# Patient Record
Sex: Female | Born: 1970 | Race: White | Hispanic: No | Marital: Single | Smoking: Never smoker
Health system: Southern US, Community
[De-identification: ages and names within clinical notes are randomized; demographics above are authoritative.]

## PROBLEM LIST (undated history)

## (undated) DIAGNOSIS — J302 Other seasonal allergic rhinitis: Secondary | ICD-10-CM

## (undated) HISTORY — PX: OTHER SURGICAL HISTORY: SHX169

---

## 2015-03-02 DIAGNOSIS — Z0000 Encounter for general adult medical examination without abnormal findings: Secondary | ICD-10-CM | POA: Diagnosis not present

## 2015-05-05 ENCOUNTER — Other Ambulatory Visit (HOSPITAL_BASED_OUTPATIENT_CLINIC_OR_DEPARTMENT_OTHER): Payer: Self-pay | Admitting: Unknown Physician Specialty

## 2015-05-05 DIAGNOSIS — Z1231 Encounter for screening mammogram for malignant neoplasm of breast: Secondary | ICD-10-CM

## 2015-05-10 DIAGNOSIS — H524 Presbyopia: Secondary | ICD-10-CM | POA: Diagnosis not present

## 2015-05-10 DIAGNOSIS — H5213 Myopia, bilateral: Secondary | ICD-10-CM | POA: Diagnosis not present

## 2015-05-16 ENCOUNTER — Ambulatory Visit (HOSPITAL_BASED_OUTPATIENT_CLINIC_OR_DEPARTMENT_OTHER)
Admission: RE | Admit: 2015-05-16 | Discharge: 2015-05-16 | Disposition: A | Discharge status: Home / Self Care | Payer: 59 | Source: Ambulatory Visit | Attending: Unknown Physician Specialty | Admitting: Unknown Physician Specialty

## 2015-05-16 DIAGNOSIS — Z1231 Encounter for screening mammogram for malignant neoplasm of breast: Secondary | ICD-10-CM | POA: Insufficient documentation

## 2015-07-13 DIAGNOSIS — Z01419 Encounter for gynecological examination (general) (routine) without abnormal findings: Secondary | ICD-10-CM | POA: Diagnosis not present

## 2015-07-13 DIAGNOSIS — Z1151 Encounter for screening for human papillomavirus (HPV): Secondary | ICD-10-CM | POA: Diagnosis not present

## 2015-10-19 ENCOUNTER — Telehealth: Payer: 59 | Admitting: Family

## 2015-10-19 DIAGNOSIS — H578 Other specified disorders of eye and adnexa: Secondary | ICD-10-CM

## 2015-10-19 DIAGNOSIS — H5789 Other specified disorders of eye and adnexa: Secondary | ICD-10-CM

## 2015-10-19 NOTE — Progress Notes (Signed)
We are sorry that you are not feeling well.  Here is how we plan to help!  Based on what you have shared with me it looks like you have conjunctivitis.  Conjunctivitis is a common inflammatory or infectious condition of the eye that is often referred to as "pink eye".  In most cases it is contagious (viral or bacterial). However, not all conjunctivitis requires antibiotics (ex. Allergic).  We have made appropriate suggestions for you based upon your presentation.  I recommend that you use OpconA, 1-2 drops every 4-6 hours (an over the counter allergy drop available at your local pharmacy).  Your pharmacist may have an alternative suggestion. It sounds like you irritated your eye. I would not wear your contacts for the next few days. You can use saline eye drops that moisturize.   Pink eye can be highly contagious.  It is typically spread through direct contact with secretions, or contaminated objects or surfaces that one may have touched.  Strict handwashing is suggested with soap and water is urged.  If not available, use alcohol based had sanitizer.  Avoid unnecessary touching of the eye.  If you wear contact lenses, you will need to refrain from wearing them until you see no white discharge from the eye for at least 24 hours after being on medication.  You should see symptom improvement in 1-2 days after starting the medication regimen.  Call us if symptoms are not improved in 1-2 days.  Home Care:  Wash your hands often!  Do not wear your contacts until you complete your treatment plan.  Avoid sharing towels, bed linen, personal items with a person who has pink eye.  See attention for anyone in your home with similar symptoms.  Get Help Right Away If:  Your symptoms do not improve.  You develop blurred or loss of vision.  Your symptoms worsen (increased discharge, pain or redness)  Your e-visit answers were reviewed by a board certified advanced clinical practitioner to complete your  personal care plan.  Depending on the condition, your plan could have included both over the counter or prescription medications.  If there is a problem please reply  once you have received a response from your provider.  Your safety is important to [place].  If you have drug allergies check your prescription carefully.    You can use MyChart to ask questions about today's visit, request a non-urgent call back, or ask for a work or school excuse for 24 hours related to this e-Visit. If it has been greater than 24 hours you will need to follow up with your provider, or enter a new e-Visit to address those concerns.   You will get an e-mail in the next two days asking about your experience.  I hope that your e-visit has been valuable and will speed your recovery. Thank you for using e-visits.

## 2016-03-02 DIAGNOSIS — Z0000 Encounter for general adult medical examination without abnormal findings: Secondary | ICD-10-CM | POA: Diagnosis not present

## 2016-03-07 DIAGNOSIS — K649 Unspecified hemorrhoids: Secondary | ICD-10-CM | POA: Diagnosis not present

## 2016-03-07 DIAGNOSIS — Z0000 Encounter for general adult medical examination without abnormal findings: Secondary | ICD-10-CM | POA: Diagnosis not present

## 2016-05-10 ENCOUNTER — Other Ambulatory Visit (HOSPITAL_BASED_OUTPATIENT_CLINIC_OR_DEPARTMENT_OTHER): Payer: Self-pay | Admitting: Unknown Physician Specialty

## 2016-05-10 DIAGNOSIS — Z1231 Encounter for screening mammogram for malignant neoplasm of breast: Secondary | ICD-10-CM

## 2016-05-21 ENCOUNTER — Ambulatory Visit (HOSPITAL_BASED_OUTPATIENT_CLINIC_OR_DEPARTMENT_OTHER)
Admission: RE | Admit: 2016-05-21 | Discharge: 2016-05-21 | Disposition: A | Discharge status: Home / Self Care | Payer: 59 | Source: Ambulatory Visit | Attending: Unknown Physician Specialty | Admitting: Unknown Physician Specialty

## 2016-05-21 DIAGNOSIS — Z1231 Encounter for screening mammogram for malignant neoplasm of breast: Secondary | ICD-10-CM | POA: Insufficient documentation

## 2016-07-25 DIAGNOSIS — D251 Intramural leiomyoma of uterus: Secondary | ICD-10-CM | POA: Diagnosis not present

## 2016-07-25 DIAGNOSIS — D250 Submucous leiomyoma of uterus: Secondary | ICD-10-CM | POA: Diagnosis not present

## 2016-07-25 DIAGNOSIS — Z01419 Encounter for gynecological examination (general) (routine) without abnormal findings: Secondary | ICD-10-CM | POA: Diagnosis not present

## 2016-07-25 DIAGNOSIS — N921 Excessive and frequent menstruation with irregular cycle: Secondary | ICD-10-CM | POA: Diagnosis not present

## 2016-08-02 MED FILL — FLUTICASONE PROP 50 MCG SPR: 50 | 30 days supply | Qty: 16 | Fill #0

## 2016-08-22 DIAGNOSIS — N921 Excessive and frequent menstruation with irregular cycle: Secondary | ICD-10-CM | POA: Diagnosis not present

## 2016-08-22 DIAGNOSIS — D250 Submucous leiomyoma of uterus: Secondary | ICD-10-CM | POA: Diagnosis not present

## 2016-08-22 DIAGNOSIS — D251 Intramural leiomyoma of uterus: Secondary | ICD-10-CM | POA: Diagnosis not present

## 2016-08-22 MED FILL — MEDROXYPROGESTERONE 10 MG T: 10 | 30 days supply | Qty: 30 | Fill #0

## 2016-11-15 DIAGNOSIS — H524 Presbyopia: Secondary | ICD-10-CM | POA: Diagnosis not present

## 2016-11-15 DIAGNOSIS — H5213 Myopia, bilateral: Secondary | ICD-10-CM | POA: Diagnosis not present

## 2016-12-09 ENCOUNTER — Emergency Department (INDEPENDENT_AMBULATORY_CARE_PROVIDER_SITE_OTHER)
Admission: EM | Admit: 2016-12-09 | Discharge: 2016-12-09 | Disposition: A | Discharge status: Home / Self Care | Payer: Worker's Compensation | Source: Home / Self Care | Attending: Emergency Medicine | Admitting: Emergency Medicine

## 2016-12-09 ENCOUNTER — Emergency Department (INDEPENDENT_AMBULATORY_CARE_PROVIDER_SITE_OTHER): Payer: Worker's Compensation

## 2016-12-09 ENCOUNTER — Encounter: Payer: Self-pay | Admitting: Emergency Medicine

## 2016-12-09 DIAGNOSIS — M25552 Pain in left hip: Secondary | ICD-10-CM

## 2016-12-09 DIAGNOSIS — S73102A Unspecified sprain of left hip, initial encounter: Secondary | ICD-10-CM | POA: Diagnosis not present

## 2016-12-09 DIAGNOSIS — S73112A Iliofemoral ligament sprain of left hip, initial encounter: Secondary | ICD-10-CM

## 2016-12-09 DIAGNOSIS — S79912A Unspecified injury of left hip, initial encounter: Secondary | ICD-10-CM | POA: Diagnosis not present

## 2016-12-09 HISTORY — DX: Other seasonal allergic rhinitis: J30.2

## 2016-12-09 MED ORDER — KETOROLAC TROMETHAMINE 60 MG/2ML IM SOLN
60.0000 mg | Freq: Once | INTRAMUSCULAR | Status: AC
  Administered 2016-12-09: 60 mg via INTRAMUSCULAR

## 2016-12-09 MED ORDER — CYCLOBENZAPRINE HCL 5 MG PO TABS: ORAL_TABLET | ORAL | 0 refills | Status: DC

## 2016-12-09 MED ORDER — MELOXICAM 7.5 MG PO TABS: ORAL_TABLET | ORAL | 0 refills | Status: DC

## 2016-12-09 NOTE — Discharge Instructions (Addendum)
You have a severe strain and sprain of left hip. Likely involving ilio febrile ligament sprain. X-ray of left hip today is negative for fracture or dislocation. For now, rest, ice, elevate. No work for now. Mild passive range of motion. Here in urgent care, we gave you shot of Toradol for pain. New prescriptions for meloxicam for pain and cyclobenzaprine for muscle relaxant. Make follow-up appointment with Dr. Barbaraann Barthel, sports medicine at [facility], in 3 days. 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

## 2016-12-09 NOTE — ED Provider Notes (Signed)
[facility]    CSN: [id number] Arrival date & time: 12/09/16  1154     History   Chief Complaint Chief Complaint  Patient presents with  . 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 She states that while on the job, 2 days ago, was caring for her patient. Ms. Egolf twisted and pivoted to prevent her patient from falling, and Ms. Hanssen felt acute and worsening left anterior and lateral hip pain. She tried ibuprofen without significant relief. The pain worsened severely today, now the pain is sharp and dull, 8 out of 10, worse with movement. No radiation. Denies back pain. No distal thigh or leg pain. No abdominal pain. No fever or chills or nausea or vomiting or chest pain or shortness of breath. Denies a prior history of left hip pain.  HPI Elaine Wyatt is a 46 y.o. female.   HPI  Past Medical History:  Diagnosis Date  . Seasonal allergies     There are no active problems to display for this patient.   Past Surgical History:  Procedure Laterality Date  . uterine fibroidectomy      OB History    No data available       Home Medications    Prior to Admission medications   Medication Sig Start Date End Date Taking? Authorizing Provider  cetirizine (ZYRTEC) 10 MG tablet Take 10 mg by mouth daily.   Yes [provider]  cyclobenzaprine (FLEXERIL) 5 MG tablet Take 1 every 8 hours as needed for muscle relaxant. May take 2 at bedtime. Caution: May cause drowsiness 12/09/16   Jacqulyn Cane, MD  meloxicam (MOBIC) 7.5 MG tablet Take 1 twice a day as needed for pain. Take with food. (Do not take with any other NSAID.) 12/09/16   Jacqulyn Cane, MD    Family History History reviewed. No pertinent family history.  Social History Social History  Substance Use Topics  . Smoking status: Never Smoker  . Smokeless tobacco: Never Used  . Alcohol use Yes     Allergies   Codeine   Review of  Systems Review of Systems  All other systems reviewed and are negative.    Physical Exam Triage Vital Signs ED Triage Vitals [12/09/16 1232]  Enc Vitals Group     BP 112/74     Pulse Rate 87     Resp 16     Temp 98.1 F (36.7 C)     Temp Source Oral     SpO2      Weight 168 lb (76.2 kg)     Height 5' 11.5" (1.816 m)     Head Circumference      Peak Flow      Pain Score 8     Pain Loc      Pain Edu?      Excl. in [place]?    No data found.   Updated Vital Signs BP 112/74 (BP Location: Left Arm)   Pulse 87   Temp 98.1 F (36.7 C) (Oral)   Resp 16   Ht 5' 11.5" (1.816 m)   Wt 168 lb (76.2 kg)   LMP 12/06/2016 (Exact Date)   BMI 23.10 kg/m   Visual Acuity Right Eye Distance:   Left Eye Distance:   Bilateral Distance:    Right Eye Near:   Left Eye Near:    Bilateral Near:     Physical Exam  Constitutional: She is oriented  to person, place, and time. She appears well-developed and well-nourished.  Alert, Cooperative. She is very uncomfortable from left hip pain, avoiding full weight-bearing of left hip, and she moves very slowly from chair to exam table  HENT:  Head: Normocephalic and atraumatic.  Eyes: Pupils are equal, round, and reactive to light. Conjunctivae are normal.  Neck: Normal range of motion. Neck supple.  Nontender. No C-spine tenderness or deformity  Cardiovascular: Normal rate and regular rhythm.   Pulmonary/Chest: Effort normal.  Abdominal: She exhibits no distension.  Musculoskeletal:       Right hip: Normal.       Left hip: She exhibits decreased range of motion, tenderness and bony tenderness. She exhibits no swelling, no deformity and no laceration.       Lumbar back: Normal.       Right upper leg: She exhibits no bony tenderness, no swelling and no edema.       Left upper leg: Normal.  Left hip pain greatly exacerbated by internal and external rotation of left hip.  Neurological: She is alert and oriented to person, place, and time. She  displays normal reflexes. No cranial nerve deficit or sensory deficit. She exhibits normal muscle tone. Coordination normal.  Skin: Skin is warm and dry. Capillary refill takes less than 2 seconds. No rash noted.  Psychiatric: She has a normal mood and affect.   She has a cane which she uses to walk slowly. Avoiding weightbearing left hip.  UC Treatments / Results  Labs (all labs ordered are listed, but only abnormal results are displayed) Labs Reviewed - No data to display  EKG  EKG Interpretation None       Radiology Dg Hip Unilat W Or Wo Pelvis 2-3 Views Left  Result Date: 12/09/2016 CLINICAL DATA:  Left hip pain, twisting injury EXAM: DG HIP (WITH OR WITHOUT PELVIS) 2-3V LEFT COMPARISON:  None. FINDINGS: No fracture or dislocation is seen. Bilateral hip joint spaces are preserved. Visualized bony pelvis appears intact. Suspected benign bone island in the left inferior pubic ramus. IMPRESSION: Negative. Electronically Signed   By: Julian Hy M.D.   On: 12/09/2016 12:36    Procedures Procedures (including critical care time)  Medications Ordered in UC Medications  ketorolac (TORADOL) injection 60 mg (not administered)     Initial Impression / Assessment and Plan / UC Course  I have reviewed the triage vital signs and the nursing notes.  Pertinent labs & imaging results that were available during my care of the patient were reviewed by me and considered in my medical decision making (see chart for details).     Reviewed with her that x-ray left hip shows no acute bony abnormality. I gave her a copy of the radiology report and we discussed suspected benign bone island in the left inferior pubic ramus, and advised to follow-up that finding with her PCP or orthopedist, and I believe the bone island is a benign and non-related to the acute left hip injury.  Final Clinical Impressions(s) / UC Diagnoses   Final diagnoses:  Sprain of left hip, initial encounter   Iliofemoral ligament sprain of left hip, initial encounter  Treatment options discussed, as well as risks, benefits, alternatives. Patient voiced understanding and agreement with the following plans: Ice, rest. Meloxicam 7.5 mg bid with food for pain. Flexeril for muscle relaxant, precautions discussed. She declined any stronger pain med, and she is trying to avoid any meds which could cause constipation, so I'm not prescribing any narcotic  pain med. An After Visit Summary was printed and given to the patient. "You have a severe strain and sprain of left hip. Likely involving ilio febrile ligament sprain. X-ray of left hip today is negative for fracture or dislocation. For now, rest, ice, elevate. No work for now. Mild passive range of motion. Here in urgent care, we gave you shot of Toradol for pain. New prescriptions for meloxicam for pain and cyclobenzaprine for muscle relaxant. Make follow-up appointment with Dr. Barbaraann Barthel, sports medicine at [facility], in 3 days. 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  New Prescriptions New Prescriptions   CYCLOBENZAPRINE (FLEXERIL) 5 MG TABLET    Take 1 every 8 hours as needed for muscle relaxant. May take 2 at bedtime. Caution: May cause drowsiness   MELOXICAM (MOBIC) 7.5 MG TABLET    Take 1 twice a day as needed for pain. Take with food. (Do not take with any other NSAID.)   Precautions discussed. Red flags discussed. Questions invited and answered. Patient voiced understanding and agreement.     Jacqulyn Cane, MD 12/09/16 2102

## 2016-12-09 NOTE — ED Triage Notes (Signed)
Immediate supervisor, Lum Babe, contacted by phone and states no drug or alcohol screening is necessary.

## 2016-12-09 NOTE — ED Triage Notes (Signed)
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 Yesterday and today has noticed progressively worse pain in left hip; came in with help of cane today. Refusing pain medication due to previous problems with serious constipation in past following pain meds.

## 2016-12-12 ENCOUNTER — Ambulatory Visit: Payer: Self-pay | Admitting: Family Medicine

## 2017-03-14 DIAGNOSIS — J3089 Other allergic rhinitis: Secondary | ICD-10-CM | POA: Diagnosis not present

## 2017-03-14 DIAGNOSIS — Z0000 Encounter for general adult medical examination without abnormal findings: Secondary | ICD-10-CM | POA: Diagnosis not present

## 2017-03-14 DIAGNOSIS — K644 Residual hemorrhoidal skin tags: Secondary | ICD-10-CM | POA: Diagnosis not present

## 2017-03-14 DIAGNOSIS — K5900 Constipation, unspecified: Secondary | ICD-10-CM | POA: Diagnosis not present

## 2017-04-05 DIAGNOSIS — R509 Fever, unspecified: Secondary | ICD-10-CM | POA: Diagnosis not present

## 2017-04-05 DIAGNOSIS — J101 Influenza due to other identified influenza virus with other respiratory manifestations: Secondary | ICD-10-CM | POA: Diagnosis not present

## 2017-04-05 MED FILL — HYDROCODONE-HOMATROPINE SOL: 5-1.5 | 4 days supply | Qty: 120 | Fill #0

## 2017-04-09 ENCOUNTER — Other Ambulatory Visit (HOSPITAL_BASED_OUTPATIENT_CLINIC_OR_DEPARTMENT_OTHER): Payer: Self-pay | Admitting: Unknown Physician Specialty

## 2017-04-09 DIAGNOSIS — Z1231 Encounter for screening mammogram for malignant neoplasm of breast: Secondary | ICD-10-CM

## 2017-05-22 ENCOUNTER — Encounter (HOSPITAL_BASED_OUTPATIENT_CLINIC_OR_DEPARTMENT_OTHER): Payer: Self-pay

## 2017-05-22 ENCOUNTER — Ambulatory Visit (HOSPITAL_BASED_OUTPATIENT_CLINIC_OR_DEPARTMENT_OTHER)
Admission: RE | Admit: 2017-05-22 | Discharge: 2017-05-22 | Disposition: A | Discharge status: Home / Self Care | Payer: 59 | Source: Ambulatory Visit | Attending: Unknown Physician Specialty | Admitting: Unknown Physician Specialty

## 2017-05-22 DIAGNOSIS — Z1231 Encounter for screening mammogram for malignant neoplasm of breast: Secondary | ICD-10-CM | POA: Insufficient documentation

## 2017-07-10 DIAGNOSIS — D2261 Melanocytic nevi of right upper limb, including shoulder: Secondary | ICD-10-CM | POA: Diagnosis not present

## 2017-07-10 DIAGNOSIS — D225 Melanocytic nevi of trunk: Secondary | ICD-10-CM | POA: Diagnosis not present

## 2017-07-10 DIAGNOSIS — D2262 Melanocytic nevi of left upper limb, including shoulder: Secondary | ICD-10-CM | POA: Diagnosis not present

## 2017-07-31 DIAGNOSIS — N951 Menopausal and female climacteric states: Secondary | ICD-10-CM | POA: Diagnosis not present

## 2017-07-31 DIAGNOSIS — N921 Excessive and frequent menstruation with irregular cycle: Secondary | ICD-10-CM | POA: Diagnosis not present

## 2017-07-31 DIAGNOSIS — Z01419 Encounter for gynecological examination (general) (routine) without abnormal findings: Secondary | ICD-10-CM | POA: Diagnosis not present

## 2017-11-21 DIAGNOSIS — H5213 Myopia, bilateral: Secondary | ICD-10-CM | POA: Diagnosis not present

## 2017-11-21 DIAGNOSIS — H524 Presbyopia: Secondary | ICD-10-CM | POA: Diagnosis not present

## 2018-03-12 DIAGNOSIS — N912 Amenorrhea, unspecified: Secondary | ICD-10-CM | POA: Diagnosis not present

## 2018-03-21 DIAGNOSIS — Z0000 Encounter for general adult medical examination without abnormal findings: Secondary | ICD-10-CM | POA: Diagnosis not present

## 2018-03-21 DIAGNOSIS — Z13220 Encounter for screening for lipoid disorders: Secondary | ICD-10-CM | POA: Diagnosis not present

## 2018-03-21 DIAGNOSIS — Z8249 Family history of ischemic heart disease and other diseases of the circulatory system: Secondary | ICD-10-CM | POA: Diagnosis not present

## 2018-03-21 DIAGNOSIS — Z1329 Encounter for screening for other suspected endocrine disorder: Secondary | ICD-10-CM | POA: Diagnosis not present

## 2018-05-14 ENCOUNTER — Other Ambulatory Visit (HOSPITAL_BASED_OUTPATIENT_CLINIC_OR_DEPARTMENT_OTHER): Payer: Self-pay | Admitting: Internal Medicine

## 2018-05-14 DIAGNOSIS — Z1231 Encounter for screening mammogram for malignant neoplasm of breast: Secondary | ICD-10-CM

## 2018-05-28 ENCOUNTER — Ambulatory Visit (HOSPITAL_BASED_OUTPATIENT_CLINIC_OR_DEPARTMENT_OTHER): Payer: Self-pay

## 2018-07-15 ENCOUNTER — Ambulatory Visit (HOSPITAL_BASED_OUTPATIENT_CLINIC_OR_DEPARTMENT_OTHER)
Admission: RE | Admit: 2018-07-15 | Discharge: 2018-07-15 | Disposition: A | Discharge status: Home / Self Care | Payer: 59 | Source: Ambulatory Visit | Attending: Internal Medicine | Admitting: Internal Medicine

## 2018-07-15 ENCOUNTER — Other Ambulatory Visit: Payer: Self-pay

## 2018-07-15 DIAGNOSIS — Z1231 Encounter for screening mammogram for malignant neoplasm of breast: Secondary | ICD-10-CM | POA: Diagnosis not present

## 2018-10-15 DIAGNOSIS — N951 Menopausal and female climacteric states: Secondary | ICD-10-CM | POA: Diagnosis not present

## 2018-10-15 DIAGNOSIS — Z01419 Encounter for gynecological examination (general) (routine) without abnormal findings: Secondary | ICD-10-CM | POA: Diagnosis not present

## 2018-11-24 DIAGNOSIS — L700 Acne vulgaris: Secondary | ICD-10-CM | POA: Diagnosis not present

## 2018-11-24 MED FILL — SPIRONOLACTONE 25 MG TABLET: 25 | 30 days supply | Qty: 60 | Fill #0

## 2018-11-24 MED FILL — CLINDAMYCIN PH 1% SOLUTION: 1 | 14 days supply | Qty: 60 | Fill #0

## 2018-12-03 DIAGNOSIS — H524 Presbyopia: Secondary | ICD-10-CM | POA: Diagnosis not present

## 2018-12-03 DIAGNOSIS — H5213 Myopia, bilateral: Secondary | ICD-10-CM | POA: Diagnosis not present

## 2018-12-22 MED FILL — SPIRONOLACTONE 25 MG TABLET: 25 | 30 days supply | Qty: 60 | Fill #1

## 2019-01-20 MED FILL — CLINDAMYCIN PHOSPHATE 1 % S: 1 | 14 days supply | Qty: 60 | Fill #1

## 2019-01-20 MED FILL — SPIRONOLACTONE 25 MG TABS: 25 | 30 days supply | Qty: 60 | Fill #2

## 2019-03-05 DIAGNOSIS — L700 Acne vulgaris: Secondary | ICD-10-CM | POA: Diagnosis not present

## 2019-03-09 MED FILL — DOXYCYCLINE HYCLATE 20 MG T: 20 | 30 days supply | Qty: 60 | Fill #0

## 2019-04-23 MED FILL — DOXYCYCLINE HYCLATE 20 MG T: 20 | 30 days supply | Qty: 60 | Fill #1

## 2019-04-23 MED FILL — CLINDAMYCIN PHOSPHATE 1 % S: 1 | 14 days supply | Qty: 60 | Fill #2

## 2019-05-08 DIAGNOSIS — N921 Excessive and frequent menstruation with irregular cycle: Secondary | ICD-10-CM | POA: Diagnosis not present

## 2019-05-08 DIAGNOSIS — J309 Allergic rhinitis, unspecified: Secondary | ICD-10-CM | POA: Diagnosis not present

## 2019-05-08 DIAGNOSIS — Z0000 Encounter for general adult medical examination without abnormal findings: Secondary | ICD-10-CM | POA: Diagnosis not present

## 2019-05-08 DIAGNOSIS — K649 Unspecified hemorrhoids: Secondary | ICD-10-CM | POA: Diagnosis not present

## 2019-05-08 DIAGNOSIS — D259 Leiomyoma of uterus, unspecified: Secondary | ICD-10-CM | POA: Diagnosis not present

## 2019-05-26 DIAGNOSIS — L700 Acne vulgaris: Secondary | ICD-10-CM | POA: Diagnosis not present

## 2019-06-17 ENCOUNTER — Other Ambulatory Visit (HOSPITAL_BASED_OUTPATIENT_CLINIC_OR_DEPARTMENT_OTHER): Payer: Self-pay | Admitting: Internal Medicine

## 2019-06-17 DIAGNOSIS — Z1231 Encounter for screening mammogram for malignant neoplasm of breast: Secondary | ICD-10-CM

## 2019-07-15 ENCOUNTER — Ambulatory Visit (HOSPITAL_BASED_OUTPATIENT_CLINIC_OR_DEPARTMENT_OTHER): Payer: 59

## 2019-07-20 ENCOUNTER — Other Ambulatory Visit: Payer: Self-pay

## 2019-07-20 ENCOUNTER — Ambulatory Visit (HOSPITAL_BASED_OUTPATIENT_CLINIC_OR_DEPARTMENT_OTHER)
Admission: RE | Admit: 2019-07-20 | Discharge: 2019-07-20 | Disposition: A | Discharge status: Home / Self Care | Payer: 59 | Source: Ambulatory Visit | Attending: Internal Medicine | Admitting: Internal Medicine

## 2019-07-20 DIAGNOSIS — Z1231 Encounter for screening mammogram for malignant neoplasm of breast: Secondary | ICD-10-CM | POA: Insufficient documentation

## 2019-08-28 MED FILL — CLINDAMYCIN PHOSPHATE 1 % S: 1 | 14 days supply | Qty: 60 | Fill #3

## 2019-10-28 DIAGNOSIS — N958 Other specified menopausal and perimenopausal disorders: Secondary | ICD-10-CM | POA: Diagnosis not present

## 2019-10-28 DIAGNOSIS — Z01419 Encounter for gynecological examination (general) (routine) without abnormal findings: Secondary | ICD-10-CM | POA: Diagnosis not present

## 2019-10-28 DIAGNOSIS — N921 Excessive and frequent menstruation with irregular cycle: Secondary | ICD-10-CM | POA: Diagnosis not present

## 2019-10-28 DIAGNOSIS — Z1151 Encounter for screening for human papillomavirus (HPV): Secondary | ICD-10-CM | POA: Diagnosis not present

## 2019-11-26 ENCOUNTER — Other Ambulatory Visit (HOSPITAL_BASED_OUTPATIENT_CLINIC_OR_DEPARTMENT_OTHER): Payer: Self-pay | Admitting: Dermatology

## 2019-11-26 DIAGNOSIS — L700 Acne vulgaris: Secondary | ICD-10-CM | POA: Diagnosis not present

## 2019-11-26 MED FILL — CLINDAMYCIN PHOSPHATE 1 % S: 1 | 14 days supply | Qty: 60 | Fill #0

## 2019-12-09 DIAGNOSIS — H524 Presbyopia: Secondary | ICD-10-CM | POA: Diagnosis not present

## 2019-12-09 DIAGNOSIS — H5213 Myopia, bilateral: Secondary | ICD-10-CM | POA: Diagnosis not present

## 2019-12-18 ENCOUNTER — Other Ambulatory Visit (HOSPITAL_BASED_OUTPATIENT_CLINIC_OR_DEPARTMENT_OTHER): Payer: Self-pay | Admitting: Internal Medicine

## 2019-12-18 ENCOUNTER — Ambulatory Visit: Payer: 59 | Attending: Internal Medicine

## 2019-12-18 DIAGNOSIS — Z23 Encounter for immunization: Secondary | ICD-10-CM

## 2019-12-25 MED FILL — PFIZER-BIONTECH COVID-19 VA: 30 | 1 days supply | Qty: 0 | Fill #0

## 2020-01-15 ENCOUNTER — Ambulatory Visit: Payer: 59

## 2020-04-05 ENCOUNTER — Other Ambulatory Visit (HOSPITAL_BASED_OUTPATIENT_CLINIC_OR_DEPARTMENT_OTHER): Payer: Self-pay | Admitting: Dermatology

## 2020-04-05 DIAGNOSIS — D2261 Melanocytic nevi of right upper limb, including shoulder: Secondary | ICD-10-CM | POA: Diagnosis not present

## 2020-04-05 DIAGNOSIS — B078 Other viral warts: Secondary | ICD-10-CM | POA: Diagnosis not present

## 2020-04-05 DIAGNOSIS — L700 Acne vulgaris: Secondary | ICD-10-CM | POA: Diagnosis not present

## 2020-04-05 DIAGNOSIS — L308 Other specified dermatitis: Secondary | ICD-10-CM | POA: Diagnosis not present

## 2020-04-05 DIAGNOSIS — D2272 Melanocytic nevi of left lower limb, including hip: Secondary | ICD-10-CM | POA: Diagnosis not present

## 2020-04-05 DIAGNOSIS — D2371 Other benign neoplasm of skin of right lower limb, including hip: Secondary | ICD-10-CM | POA: Diagnosis not present

## 2020-04-05 DIAGNOSIS — D225 Melanocytic nevi of trunk: Secondary | ICD-10-CM | POA: Diagnosis not present

## 2020-04-05 DIAGNOSIS — D2262 Melanocytic nevi of left upper limb, including shoulder: Secondary | ICD-10-CM | POA: Diagnosis not present

## 2020-04-05 MED FILL — CLOBETASOL PROPIONATE 0.05: 0.05 | 15 days supply | Qty: 15 | Fill #0

## 2020-04-11 MED FILL — CLINDAMYCIN PHOSPHATE 1 % S: 1 | 14 days supply | Qty: 60 | Fill #1

## 2020-05-11 DIAGNOSIS — Z130 Encounter for screening for diseases of the blood and blood-forming organs and certain disorders involving the immune mechanism: Secondary | ICD-10-CM | POA: Diagnosis not present

## 2020-05-11 DIAGNOSIS — Z1329 Encounter for screening for other suspected endocrine disorder: Secondary | ICD-10-CM | POA: Diagnosis not present

## 2020-05-11 DIAGNOSIS — Z0000 Encounter for general adult medical examination without abnormal findings: Secondary | ICD-10-CM | POA: Diagnosis not present

## 2020-05-11 DIAGNOSIS — Z13220 Encounter for screening for lipoid disorders: Secondary | ICD-10-CM | POA: Diagnosis not present

## 2020-05-11 DIAGNOSIS — L719 Rosacea, unspecified: Secondary | ICD-10-CM | POA: Diagnosis not present

## 2020-05-11 DIAGNOSIS — Z0001 Encounter for general adult medical examination with abnormal findings: Secondary | ICD-10-CM | POA: Diagnosis not present

## 2020-05-11 DIAGNOSIS — Z23 Encounter for immunization: Secondary | ICD-10-CM | POA: Diagnosis not present

## 2020-05-11 DIAGNOSIS — Z8249 Family history of ischemic heart disease and other diseases of the circulatory system: Secondary | ICD-10-CM | POA: Diagnosis not present

## 2020-05-12 ENCOUNTER — Other Ambulatory Visit (HOSPITAL_BASED_OUTPATIENT_CLINIC_OR_DEPARTMENT_OTHER): Payer: Self-pay | Admitting: Internal Medicine

## 2020-05-16 IMAGING — MG DIGITAL SCREENING BILATERAL MAMMOGRAM WITH TOMO AND CAD
8 series · 9 of 24 positions shown · non-contrast
Comparison: Previous exam(s).

CLINICAL DATA: Screening.

EXAM:
DIGITAL SCREENING BILATERAL MAMMOGRAM WITH TOMO AND CAD

[L MLO synth-2D]
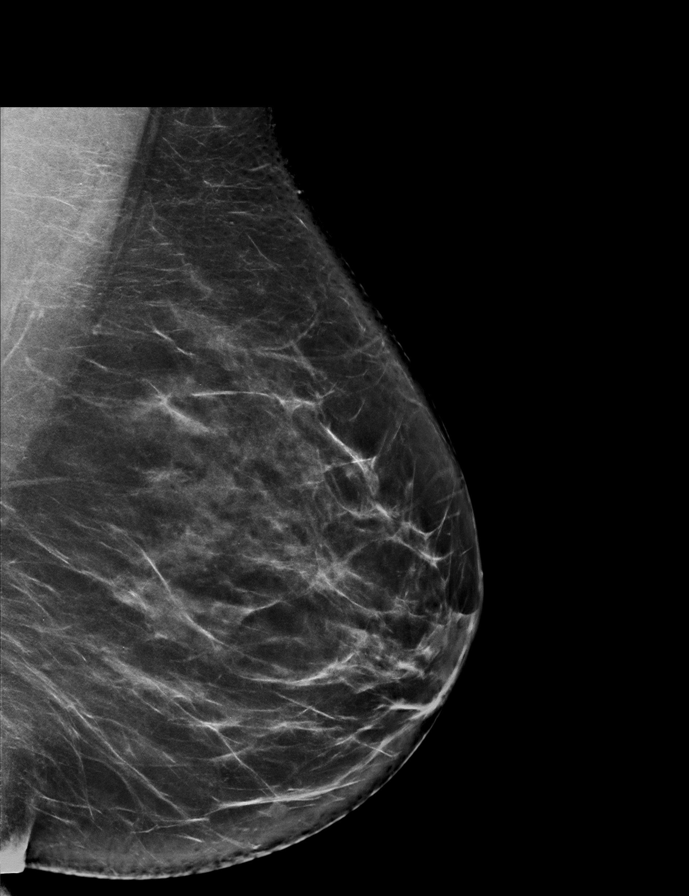

[L CC synth-2D]
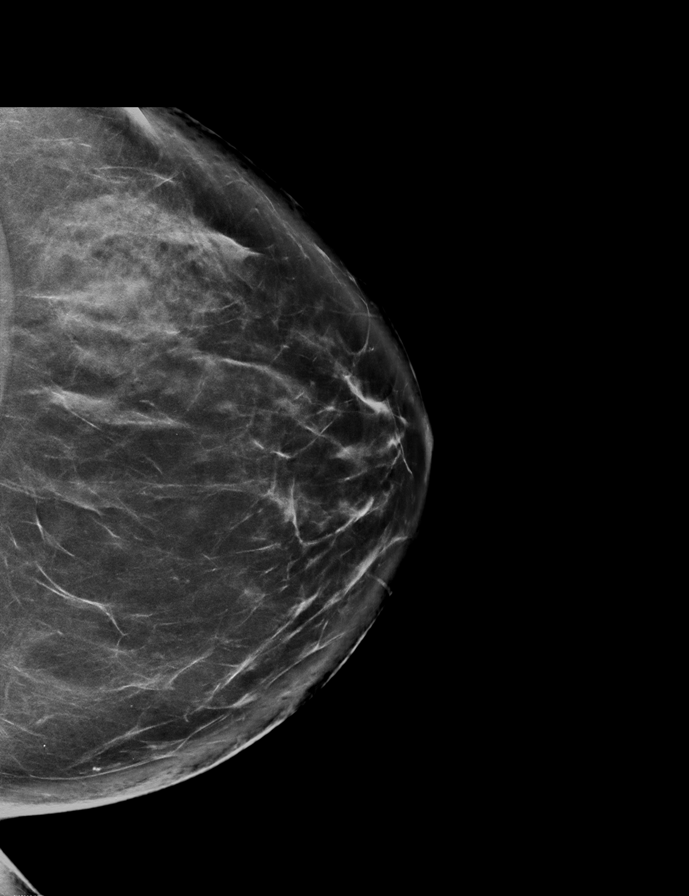

[R CC synth-2D]
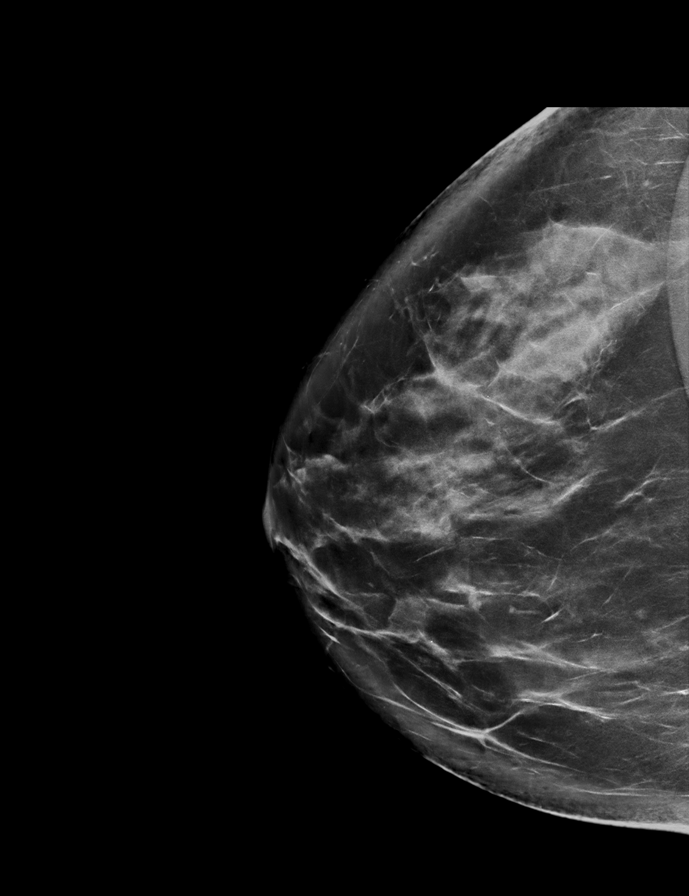

[R MLO synth-2D]
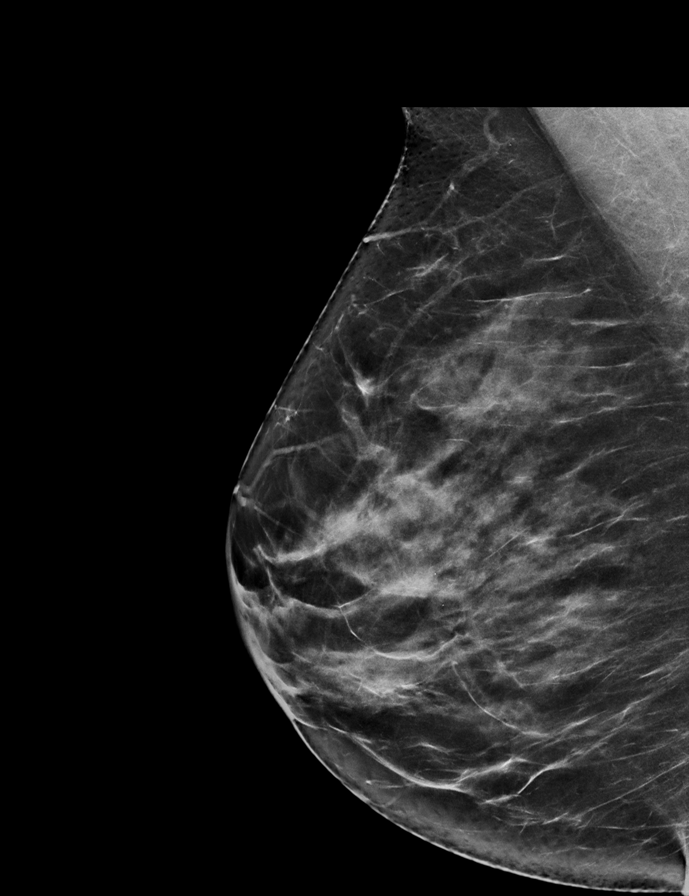

[R CC tomo · 2 of 90 frames shown]
[frame 29/90]
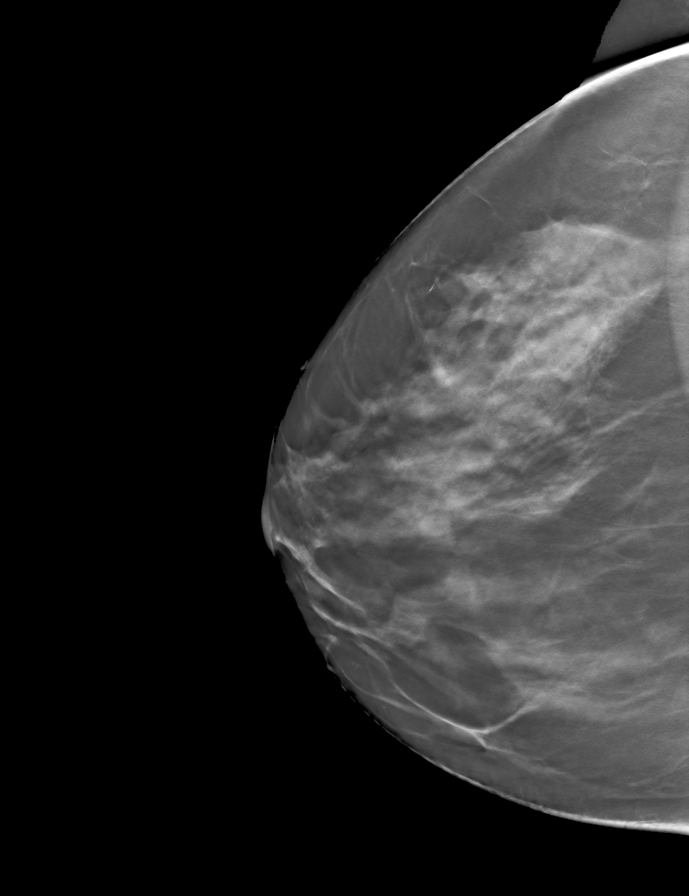
[frame 45/90]
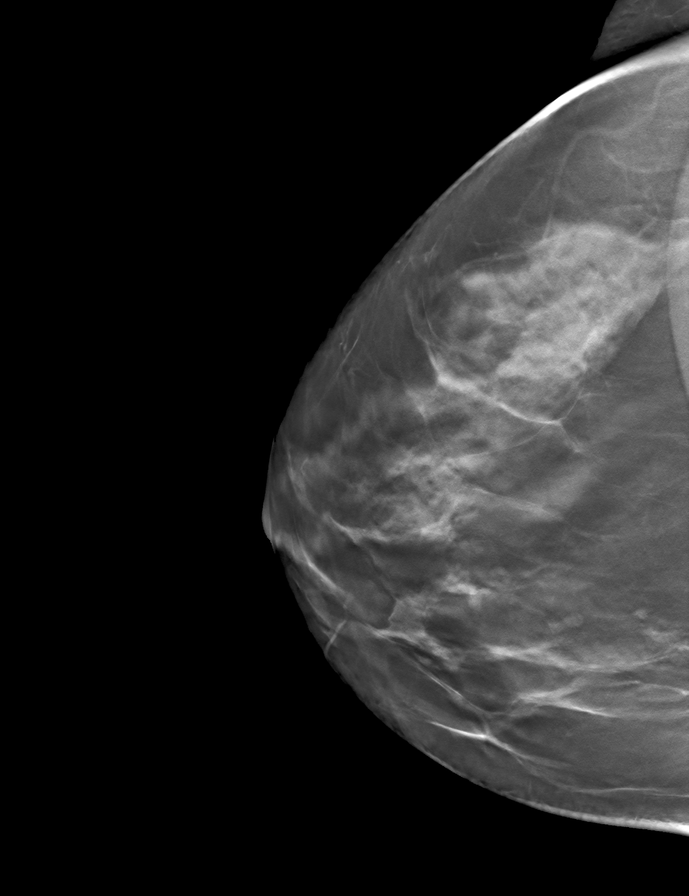

[L MLO tomo · tomo slice 43/86.0]
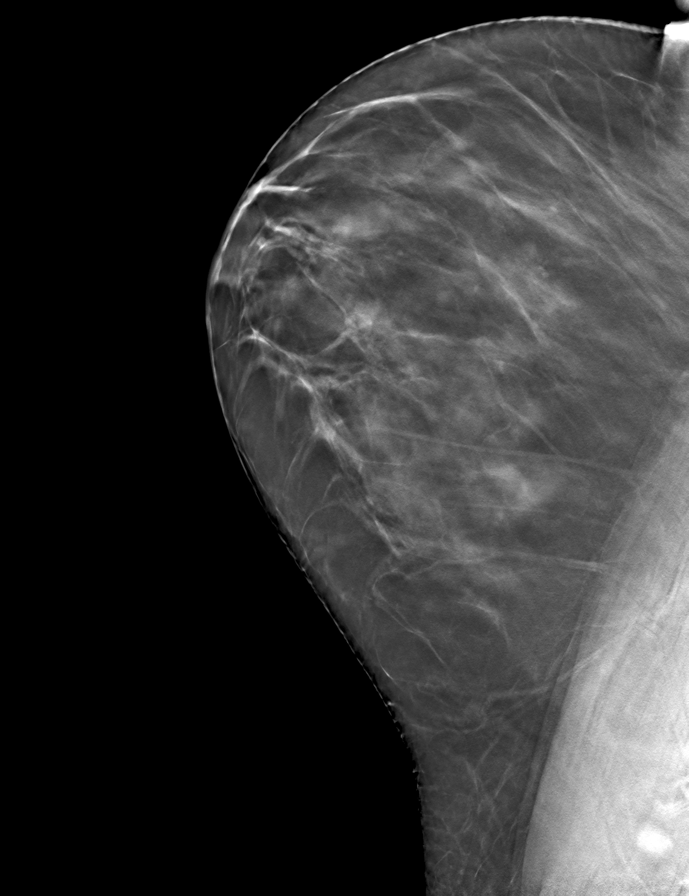

[R MLO tomo · tomo slice 40/79.0]
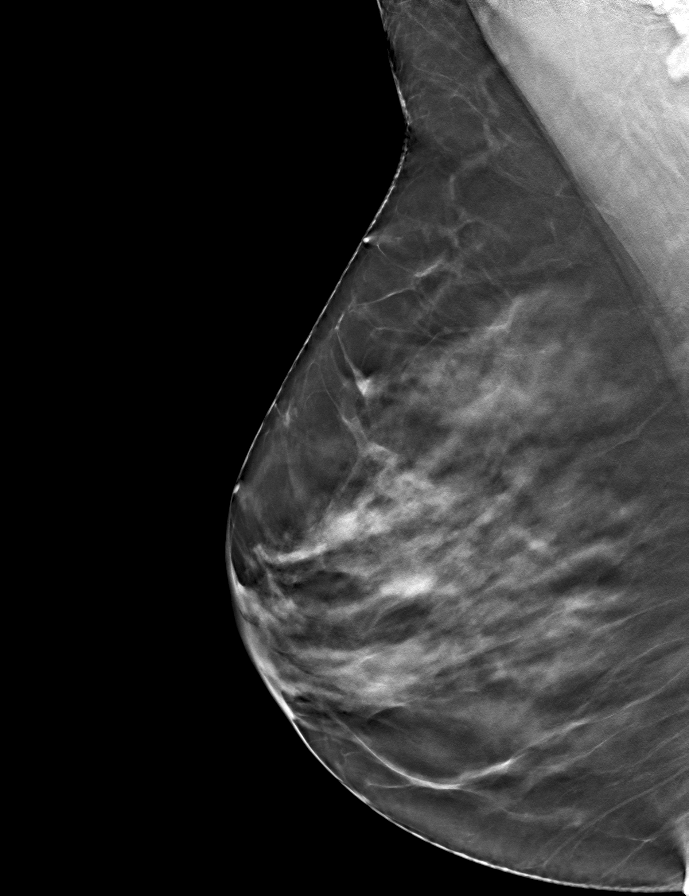

[L CC tomo · tomo slice 44/87.0]
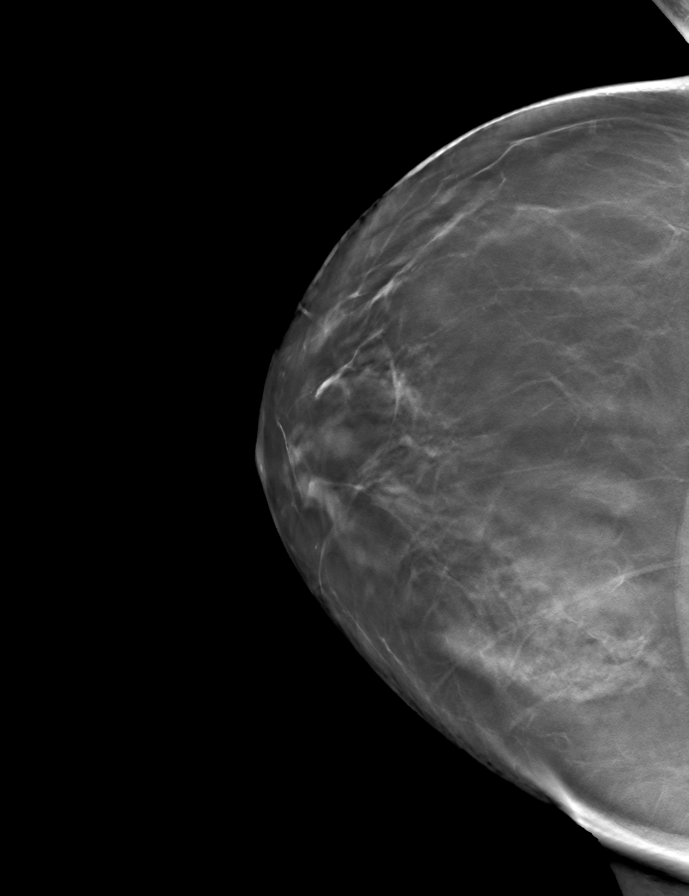

[9 of 24 positions shown; findings below may reference images not displayed]

ACR Breast Density Category c: The breast tissue is heterogeneously
dense, which may obscure small masses.
FINDINGS: There are no findings suspicious for malignancy. Images were
processed with CAD.
IMPRESSION: No mammographic evidence of malignancy. A result letter of this
screening mammogram will be mailed directly to the patient.

RECOMMENDATION:
Screening mammogram in one year. (Code:[id number]-[id number])

BI-RADS CATEGORY  1: Negative.

## 2020-06-08 ENCOUNTER — Other Ambulatory Visit (HOSPITAL_BASED_OUTPATIENT_CLINIC_OR_DEPARTMENT_OTHER): Payer: Self-pay

## 2020-06-08 ENCOUNTER — Other Ambulatory Visit (HOSPITAL_BASED_OUTPATIENT_CLINIC_OR_DEPARTMENT_OTHER): Payer: Self-pay | Admitting: Internal Medicine

## 2020-06-08 DIAGNOSIS — Z1231 Encounter for screening mammogram for malignant neoplasm of breast: Secondary | ICD-10-CM

## 2020-06-08 MED FILL — Rosuvastatin Calcium Tab 5 MG: ORAL | 30 days supply | Qty: 30 | Fill #0 | Status: AC

## 2020-07-05 ENCOUNTER — Other Ambulatory Visit (HOSPITAL_BASED_OUTPATIENT_CLINIC_OR_DEPARTMENT_OTHER): Payer: Self-pay

## 2020-07-05 MED ORDER — ROSUVASTATIN CALCIUM 5 MG PO TABS
ORAL_TABLET | ORAL | 3 refills | Status: DC
  Filled 2020-07-05: qty 30, 30d supply, fill #0
  Filled 2020-08-17: qty 30, 30d supply, fill #1
  Filled 2020-09-12: qty 30, 30d supply, fill #2
  Filled 2020-10-11: qty 30, 30d supply, fill #3

## 2020-07-21 ENCOUNTER — Other Ambulatory Visit: Payer: Self-pay

## 2020-07-21 ENCOUNTER — Encounter (HOSPITAL_BASED_OUTPATIENT_CLINIC_OR_DEPARTMENT_OTHER): Payer: Self-pay

## 2020-07-21 ENCOUNTER — Ambulatory Visit (HOSPITAL_BASED_OUTPATIENT_CLINIC_OR_DEPARTMENT_OTHER)
Admission: RE | Admit: 2020-07-21 | Discharge: 2020-07-21 | Disposition: A | Discharge status: Home / Self Care | Payer: 59 | Source: Ambulatory Visit | Attending: Internal Medicine | Admitting: Internal Medicine

## 2020-07-21 ENCOUNTER — Other Ambulatory Visit (HOSPITAL_BASED_OUTPATIENT_CLINIC_OR_DEPARTMENT_OTHER): Payer: Self-pay

## 2020-07-21 DIAGNOSIS — Z1231 Encounter for screening mammogram for malignant neoplasm of breast: Secondary | ICD-10-CM | POA: Diagnosis not present

## 2020-07-21 MED FILL — Clindamycin Phosphate Soln 1%: CUTANEOUS | 30 days supply | Qty: 60 | Fill #0 | Status: AC

## 2020-07-22 ENCOUNTER — Ambulatory Visit (HOSPITAL_BASED_OUTPATIENT_CLINIC_OR_DEPARTMENT_OTHER): Payer: 59

## 2020-08-12 DIAGNOSIS — E785 Hyperlipidemia, unspecified: Secondary | ICD-10-CM | POA: Diagnosis not present

## 2020-08-17 ENCOUNTER — Other Ambulatory Visit (HOSPITAL_BASED_OUTPATIENT_CLINIC_OR_DEPARTMENT_OTHER): Payer: Self-pay

## 2020-09-12 ENCOUNTER — Other Ambulatory Visit (HOSPITAL_BASED_OUTPATIENT_CLINIC_OR_DEPARTMENT_OTHER): Payer: Self-pay

## 2020-10-11 ENCOUNTER — Other Ambulatory Visit (HOSPITAL_BASED_OUTPATIENT_CLINIC_OR_DEPARTMENT_OTHER): Payer: Self-pay

## 2020-11-09 ENCOUNTER — Other Ambulatory Visit (HOSPITAL_BASED_OUTPATIENT_CLINIC_OR_DEPARTMENT_OTHER): Payer: Self-pay

## 2020-11-09 DIAGNOSIS — Z01419 Encounter for gynecological examination (general) (routine) without abnormal findings: Secondary | ICD-10-CM | POA: Diagnosis not present

## 2020-11-09 MED ORDER — ROSUVASTATIN CALCIUM 5 MG PO TABS
5.0000 mg | ORAL_TABLET | Freq: Every day | ORAL | 3 refills | Status: DC
  Filled 2020-11-09: qty 30, 30d supply, fill #0
  Filled 2020-12-08: qty 30, 30d supply, fill #1
  Filled 2021-01-10: qty 30, 30d supply, fill #2
  Filled 2021-02-09: qty 30, 30d supply, fill #3

## 2020-11-09 MED FILL — Clindamycin Phosphate Soln 1%: CUTANEOUS | 30 days supply | Qty: 60 | Fill #1 | Status: AC

## 2020-11-24 DIAGNOSIS — L700 Acne vulgaris: Secondary | ICD-10-CM | POA: Diagnosis not present

## 2020-12-09 ENCOUNTER — Other Ambulatory Visit (HOSPITAL_BASED_OUTPATIENT_CLINIC_OR_DEPARTMENT_OTHER): Payer: Self-pay

## 2020-12-14 DIAGNOSIS — H5213 Myopia, bilateral: Secondary | ICD-10-CM | POA: Diagnosis not present

## 2020-12-16 ENCOUNTER — Ambulatory Visit: Payer: 59 | Attending: Internal Medicine

## 2020-12-16 DIAGNOSIS — Z23 Encounter for immunization: Secondary | ICD-10-CM

## 2020-12-16 NOTE — Progress Notes (Signed)
   Covid-19 Vaccination Clinic  Name:  Elaine Wyatt    MRN: [id number] DOB: 01/23/1971  12/16/2020  [name] was observed post Covid-19 immunization for 15 minutes without incident. She was provided with Vaccine Information Sheet and instruction to access the V-Safe system.   [name] was instructed to call 911 with any severe reactions post vaccine: Difficulty breathing  Swelling of face and throat  A fast heartbeat  A bad rash all over body  Dizziness and weakness   Immunizations Administered     Name Date Dose VIS Date Route   Pfizer Covid-19 Vaccine Bivalent Booster 12/16/2020 12:53 PM 0.3 mL 10/26/2020 Intramuscular   Manufacturer: [place]   Lot: [id number]   Marianne: [phone]

## 2021-01-10 ENCOUNTER — Other Ambulatory Visit (HOSPITAL_BASED_OUTPATIENT_CLINIC_OR_DEPARTMENT_OTHER): Payer: Self-pay

## 2021-01-13 ENCOUNTER — Other Ambulatory Visit (HOSPITAL_BASED_OUTPATIENT_CLINIC_OR_DEPARTMENT_OTHER): Payer: Self-pay

## 2021-01-13 MED ORDER — PFIZER COVID-19 VAC BIVALENT 30 MCG/0.3ML IM SUSP
INTRAMUSCULAR | 0 refills | Status: DC
  Filled 2021-01-13: qty 0.3, 1d supply, fill #0

## 2021-02-09 ENCOUNTER — Other Ambulatory Visit (HOSPITAL_BASED_OUTPATIENT_CLINIC_OR_DEPARTMENT_OTHER): Payer: Self-pay

## 2021-03-15 ENCOUNTER — Other Ambulatory Visit (HOSPITAL_BASED_OUTPATIENT_CLINIC_OR_DEPARTMENT_OTHER): Payer: Self-pay

## 2021-03-15 MED ORDER — ROSUVASTATIN CALCIUM 5 MG PO TABS
5.0000 mg | ORAL_TABLET | Freq: Every day | ORAL | 3 refills | Status: DC
  Filled 2021-03-15: qty 30, 30d supply, fill #0
  Filled 2021-04-09: qty 30, 30d supply, fill #1
  Filled 2021-05-11: qty 30, 30d supply, fill #2
  Filled 2021-06-09: qty 30, 30d supply, fill #3

## 2021-04-06 ENCOUNTER — Other Ambulatory Visit (HOSPITAL_BASED_OUTPATIENT_CLINIC_OR_DEPARTMENT_OTHER): Payer: Self-pay

## 2021-04-07 ENCOUNTER — Other Ambulatory Visit (HOSPITAL_BASED_OUTPATIENT_CLINIC_OR_DEPARTMENT_OTHER): Payer: Self-pay

## 2021-04-10 ENCOUNTER — Other Ambulatory Visit (HOSPITAL_BASED_OUTPATIENT_CLINIC_OR_DEPARTMENT_OTHER): Payer: Self-pay

## 2021-04-10 MED ORDER — CLINDAMYCIN PHOSPHATE 1 % EX SOLN
CUTANEOUS | 11 refills | Status: AC
  Filled 2021-04-10: qty 60, 30d supply, fill #0
  Filled 2021-08-06: qty 60, 30d supply, fill #1

## 2021-05-12 ENCOUNTER — Other Ambulatory Visit (HOSPITAL_BASED_OUTPATIENT_CLINIC_OR_DEPARTMENT_OTHER): Payer: Self-pay

## 2021-05-24 DIAGNOSIS — Z1389 Encounter for screening for other disorder: Secondary | ICD-10-CM | POA: Diagnosis not present

## 2021-05-24 DIAGNOSIS — Z1329 Encounter for screening for other suspected endocrine disorder: Secondary | ICD-10-CM | POA: Diagnosis not present

## 2021-05-24 DIAGNOSIS — Z1211 Encounter for screening for malignant neoplasm of colon: Secondary | ICD-10-CM | POA: Diagnosis not present

## 2021-05-24 DIAGNOSIS — Z0000 Encounter for general adult medical examination without abnormal findings: Secondary | ICD-10-CM | POA: Diagnosis not present

## 2021-05-24 DIAGNOSIS — Z0001 Encounter for general adult medical examination with abnormal findings: Secondary | ICD-10-CM | POA: Diagnosis not present

## 2021-05-24 DIAGNOSIS — Z13220 Encounter for screening for lipoid disorders: Secondary | ICD-10-CM | POA: Diagnosis not present

## 2021-05-24 DIAGNOSIS — Z23 Encounter for immunization: Secondary | ICD-10-CM | POA: Diagnosis not present

## 2021-06-07 ENCOUNTER — Other Ambulatory Visit (HOSPITAL_BASED_OUTPATIENT_CLINIC_OR_DEPARTMENT_OTHER): Payer: Self-pay | Admitting: Internal Medicine

## 2021-06-07 DIAGNOSIS — Z1231 Encounter for screening mammogram for malignant neoplasm of breast: Secondary | ICD-10-CM

## 2021-06-09 ENCOUNTER — Other Ambulatory Visit (HOSPITAL_BASED_OUTPATIENT_CLINIC_OR_DEPARTMENT_OTHER): Payer: Self-pay

## 2021-07-12 ENCOUNTER — Other Ambulatory Visit (HOSPITAL_BASED_OUTPATIENT_CLINIC_OR_DEPARTMENT_OTHER): Payer: Self-pay

## 2021-07-12 MED ORDER — ROSUVASTATIN CALCIUM 5 MG PO TABS
5.0000 mg | ORAL_TABLET | Freq: Every day | ORAL | 3 refills | Status: DC
  Filled 2021-07-12: qty 30, 30d supply, fill #0
  Filled 2021-08-06: qty 30, 30d supply, fill #1
  Filled 2021-09-06: qty 30, 30d supply, fill #2
  Filled 2021-10-05: qty 30, 30d supply, fill #3

## 2021-08-02 ENCOUNTER — Ambulatory Visit (HOSPITAL_BASED_OUTPATIENT_CLINIC_OR_DEPARTMENT_OTHER): Payer: 59

## 2021-08-07 ENCOUNTER — Other Ambulatory Visit (HOSPITAL_BASED_OUTPATIENT_CLINIC_OR_DEPARTMENT_OTHER): Payer: Self-pay

## 2021-08-08 ENCOUNTER — Ambulatory Visit (HOSPITAL_BASED_OUTPATIENT_CLINIC_OR_DEPARTMENT_OTHER): Payer: 59

## 2021-08-09 ENCOUNTER — Ambulatory Visit (HOSPITAL_BASED_OUTPATIENT_CLINIC_OR_DEPARTMENT_OTHER)
Admission: RE | Admit: 2021-08-09 | Discharge: 2021-08-09 | Disposition: A | Discharge status: Home / Self Care | Payer: 59 | Source: Ambulatory Visit | Attending: Internal Medicine | Admitting: Internal Medicine

## 2021-08-09 ENCOUNTER — Encounter (HOSPITAL_BASED_OUTPATIENT_CLINIC_OR_DEPARTMENT_OTHER): Payer: Self-pay

## 2021-08-09 DIAGNOSIS — Z1231 Encounter for screening mammogram for malignant neoplasm of breast: Secondary | ICD-10-CM | POA: Diagnosis not present

## 2021-09-04 ENCOUNTER — Other Ambulatory Visit (HOSPITAL_BASED_OUTPATIENT_CLINIC_OR_DEPARTMENT_OTHER): Payer: Self-pay

## 2021-09-06 ENCOUNTER — Other Ambulatory Visit (HOSPITAL_BASED_OUTPATIENT_CLINIC_OR_DEPARTMENT_OTHER): Payer: Self-pay

## 2021-10-06 ENCOUNTER — Other Ambulatory Visit (HOSPITAL_BASED_OUTPATIENT_CLINIC_OR_DEPARTMENT_OTHER): Payer: Self-pay

## 2021-10-27 ENCOUNTER — Other Ambulatory Visit (HOSPITAL_BASED_OUTPATIENT_CLINIC_OR_DEPARTMENT_OTHER): Payer: Self-pay

## 2021-10-27 MED ORDER — SHINGRIX 50 MCG/0.5ML IM SUSR
INTRAMUSCULAR | 0 refills | Status: DC
  Filled 2021-10-27: qty 1, 1d supply, fill #0

## 2021-11-01 ENCOUNTER — Other Ambulatory Visit (HOSPITAL_BASED_OUTPATIENT_CLINIC_OR_DEPARTMENT_OTHER): Payer: Self-pay

## 2021-11-02 ENCOUNTER — Other Ambulatory Visit (HOSPITAL_BASED_OUTPATIENT_CLINIC_OR_DEPARTMENT_OTHER): Payer: Self-pay

## 2021-11-02 MED ORDER — ROSUVASTATIN CALCIUM 5 MG PO TABS
5.0000 mg | ORAL_TABLET | Freq: Every day | ORAL | 3 refills | Status: DC
  Filled 2021-11-02: qty 90, 90d supply, fill #0
  Filled 2022-01-28: qty 90, 90d supply, fill #1

## 2021-11-28 ENCOUNTER — Other Ambulatory Visit (HOSPITAL_BASED_OUTPATIENT_CLINIC_OR_DEPARTMENT_OTHER): Payer: Self-pay

## 2021-11-28 DIAGNOSIS — D2262 Melanocytic nevi of left upper limb, including shoulder: Secondary | ICD-10-CM | POA: Diagnosis not present

## 2021-11-28 DIAGNOSIS — L821 Other seborrheic keratosis: Secondary | ICD-10-CM | POA: Diagnosis not present

## 2021-11-28 DIAGNOSIS — D2272 Melanocytic nevi of left lower limb, including hip: Secondary | ICD-10-CM | POA: Diagnosis not present

## 2021-11-28 DIAGNOSIS — L700 Acne vulgaris: Secondary | ICD-10-CM | POA: Diagnosis not present

## 2021-11-28 DIAGNOSIS — D225 Melanocytic nevi of trunk: Secondary | ICD-10-CM | POA: Diagnosis not present

## 2021-11-28 DIAGNOSIS — D2371 Other benign neoplasm of skin of right lower limb, including hip: Secondary | ICD-10-CM | POA: Diagnosis not present

## 2021-11-28 DIAGNOSIS — D2261 Melanocytic nevi of right upper limb, including shoulder: Secondary | ICD-10-CM | POA: Diagnosis not present

## 2021-11-28 MED ORDER — CLINDAMYCIN PHOSPHATE 1 % EX SOLN
CUTANEOUS | 11 refills | Status: AC
  Filled 2021-11-28: qty 60, 30d supply, fill #0
  Filled 2022-04-18: qty 60, 30d supply, fill #1
  Filled 2022-08-17: qty 60, 30d supply, fill #2

## 2021-12-06 DIAGNOSIS — Z6825 Body mass index (BMI) 25.0-25.9, adult: Secondary | ICD-10-CM | POA: Diagnosis not present

## 2021-12-06 DIAGNOSIS — N951 Menopausal and female climacteric states: Secondary | ICD-10-CM | POA: Diagnosis not present

## 2021-12-06 DIAGNOSIS — R61 Generalized hyperhidrosis: Secondary | ICD-10-CM | POA: Diagnosis not present

## 2021-12-06 DIAGNOSIS — R635 Abnormal weight gain: Secondary | ICD-10-CM | POA: Diagnosis not present

## 2021-12-06 DIAGNOSIS — Z01411 Encounter for gynecological examination (general) (routine) with abnormal findings: Secondary | ICD-10-CM | POA: Diagnosis not present

## 2021-12-20 DIAGNOSIS — H524 Presbyopia: Secondary | ICD-10-CM | POA: Diagnosis not present

## 2021-12-20 DIAGNOSIS — H52223 Regular astigmatism, bilateral: Secondary | ICD-10-CM | POA: Diagnosis not present

## 2021-12-20 DIAGNOSIS — H5213 Myopia, bilateral: Secondary | ICD-10-CM | POA: Diagnosis not present

## 2022-01-05 ENCOUNTER — Other Ambulatory Visit (HOSPITAL_BASED_OUTPATIENT_CLINIC_OR_DEPARTMENT_OTHER): Payer: Self-pay

## 2022-01-05 MED ORDER — COMIRNATY 30 MCG/0.3ML IM SUSY
PREFILLED_SYRINGE | INTRAMUSCULAR | 0 refills | Status: DC
  Filled 2022-01-05 (×2): qty 0.3, 1d supply, fill #0

## 2022-01-05 MED ORDER — COMIRNATY 30 MCG/0.3ML IM SUSY
PREFILLED_SYRINGE | INTRAMUSCULAR | 0 refills | Status: DC
  Filled 2022-01-05: qty 0.3, 1d supply, fill #0

## 2022-01-29 ENCOUNTER — Other Ambulatory Visit (HOSPITAL_BASED_OUTPATIENT_CLINIC_OR_DEPARTMENT_OTHER): Payer: Self-pay

## 2022-01-29 MED ORDER — ROSUVASTATIN CALCIUM 5 MG PO TABS
5.0000 mg | ORAL_TABLET | Freq: Every day | ORAL | 3 refills | Status: DC
  Filled 2022-01-29: qty 90, 90d supply, fill #0
  Filled 2022-04-27: qty 30, 30d supply, fill #1

## 2022-02-27 ENCOUNTER — Telehealth: Payer: 59 | Admitting: Physician Assistant

## 2022-02-27 ENCOUNTER — Other Ambulatory Visit (HOSPITAL_BASED_OUTPATIENT_CLINIC_OR_DEPARTMENT_OTHER): Payer: Self-pay

## 2022-02-27 DIAGNOSIS — J208 Acute bronchitis due to other specified organisms: Secondary | ICD-10-CM | POA: Diagnosis not present

## 2022-02-27 MED ORDER — PREDNISONE 20 MG PO TABS
40.0000 mg | ORAL_TABLET | Freq: Every day | ORAL | 0 refills | Status: DC
  Filled 2022-02-27: qty 10, 5d supply, fill #0

## 2022-02-27 MED ORDER — BENZONATATE 100 MG PO CAPS
100.0000 mg | ORAL_CAPSULE | Freq: Three times a day (TID) | ORAL | 0 refills | Status: DC | PRN
  Filled 2022-02-27: qty 30, 10d supply, fill #0

## 2022-02-27 NOTE — Progress Notes (Signed)
We are sorry that you are not feeling well.  Here is how we plan to help!  Based on your presentation I believe you most likely have A cough due to a virus.  This is called viral bronchitis and is best treated by rest, plenty of fluids and control of the cough.  You may use Ibuprofen or Tylenol as directed to help your symptoms.     In addition you may use A prescription cough medication called Tessalon Perles '100mg'$ . You may take 1-2 capsules every 8 hours as needed for your cough.  I have also sent in a short course of prednisone to use as directed.   From your responses in the eVisit questionnaire you describe inflammation in the upper respiratory tract which is causing a significant cough.  This is commonly called Bronchitis and has four common causes:   Allergies Viral Infections Acid Reflux Bacterial Infection Allergies, viruses and acid reflux are treated by controlling symptoms or eliminating the cause. An example might be a cough caused by taking certain blood pressure medications. You stop the cough by changing the medication. Another example might be a cough caused by acid reflux. Controlling the reflux helps control the cough.  USE OF BRONCHODILATOR ("RESCUE") INHALERS: There is a risk from using your bronchodilator too frequently.  The risk is that over-reliance on a medication which only relaxes the muscles surrounding the breathing tubes can reduce the effectiveness of medications prescribed to reduce swelling and congestion of the tubes themselves.  Although you feel brief relief from the bronchodilator inhaler, your asthma may actually be worsening with the tubes becoming more swollen and filled with mucus.  This can delay other crucial treatments, such as oral steroid medications. If you need to use a bronchodilator inhaler daily, several times per day, you should discuss this with your provider.  There are probably better treatments that could be used to keep your asthma under  control.     HOME CARE Only take medications as instructed by your medical team. Complete the entire course of an antibiotic. Drink plenty of fluids and get plenty of rest. Avoid close contacts especially the very young and the elderly Cover your mouth if you cough or cough into your sleeve. Always remember to wash your hands A steam or ultrasonic humidifier can help congestion.   GET HELP RIGHT AWAY IF: You develop worsening fever. You become short of breath You cough up blood. Your symptoms persist after you have completed your treatment plan MAKE SURE YOU  Understand these instructions. Will watch your condition. Will get help right away if you are not doing well or get worse.    Thank you for choosing an e-visit.  Your e-visit answers were reviewed by a board certified advanced clinical practitioner to complete your personal care plan. Depending upon the condition, your plan could have included both over the counter or prescription medications.  Please review your pharmacy choice. Make sure the pharmacy is open so you can pick up prescription now. If there is a problem, you may contact your provider through [employer] and have the prescription routed to another pharmacy.  Your safety is important to [place]. If you have drug allergies check your prescription carefully.   For the next 24 hours you can use MyChart to ask questions about today's visit, request a non-urgent call back, or ask for a work or school excuse. You will get an email in the next two days asking about your experience. I hope that  your e-visit has been valuable and will speed your recovery.

## 2022-02-27 NOTE — Progress Notes (Signed)
I have spent 5 minutes in review of e-visit questionnaire, review and updating patient chart, medical decision making and response to patient.   Maleyah Evans Cody Ulonda Klosowski, PA-C    

## 2022-02-28 ENCOUNTER — Other Ambulatory Visit (HOSPITAL_BASED_OUTPATIENT_CLINIC_OR_DEPARTMENT_OTHER): Payer: Self-pay

## 2022-02-28 MED ORDER — PROMETHAZINE-DM 6.25-15 MG/5ML PO SYRP
5.0000 mL | ORAL_SOLUTION | Freq: Four times a day (QID) | ORAL | 0 refills | Status: DC | PRN
  Filled 2022-02-28: qty 118, 6d supply, fill #0

## 2022-02-28 NOTE — Addendum Note (Signed)
Addended by: Mar Daring on: 02/28/2022 07:16 AM   Modules accepted: Orders

## 2022-03-21 ENCOUNTER — Telehealth: Payer: 59 | Admitting: Physician Assistant

## 2022-03-21 DIAGNOSIS — B9689 Other specified bacterial agents as the cause of diseases classified elsewhere: Secondary | ICD-10-CM | POA: Diagnosis not present

## 2022-03-21 DIAGNOSIS — J019 Acute sinusitis, unspecified: Secondary | ICD-10-CM | POA: Diagnosis not present

## 2022-03-21 MED ORDER — AMOXICILLIN-POT CLAVULANATE 875-125 MG PO TABS
1.0000 | ORAL_TABLET | Freq: Two times a day (BID) | ORAL | 0 refills | Status: DC
  Filled 2022-03-21: qty 14, 7d supply, fill #0

## 2022-03-21 NOTE — Progress Notes (Signed)

## 2022-03-22 ENCOUNTER — Other Ambulatory Visit (HOSPITAL_BASED_OUTPATIENT_CLINIC_OR_DEPARTMENT_OTHER): Payer: Self-pay

## 2022-04-27 ENCOUNTER — Other Ambulatory Visit (HOSPITAL_BASED_OUTPATIENT_CLINIC_OR_DEPARTMENT_OTHER): Payer: Self-pay

## 2022-05-08 ENCOUNTER — Other Ambulatory Visit (HOSPITAL_BASED_OUTPATIENT_CLINIC_OR_DEPARTMENT_OTHER): Payer: Self-pay

## 2022-05-25 ENCOUNTER — Other Ambulatory Visit (HOSPITAL_BASED_OUTPATIENT_CLINIC_OR_DEPARTMENT_OTHER): Payer: Self-pay

## 2022-05-25 MED ORDER — ROSUVASTATIN CALCIUM 5 MG PO TABS
5.0000 mg | ORAL_TABLET | Freq: Every day | ORAL | 3 refills | Status: DC
  Filled 2022-05-25: qty 90, 90d supply, fill #0
  Filled 2022-08-28: qty 30, 30d supply, fill #1

## 2022-07-04 ENCOUNTER — Other Ambulatory Visit (HOSPITAL_BASED_OUTPATIENT_CLINIC_OR_DEPARTMENT_OTHER): Payer: Self-pay

## 2022-07-04 DIAGNOSIS — B9689 Other specified bacterial agents as the cause of diseases classified elsewhere: Secondary | ICD-10-CM | POA: Diagnosis not present

## 2022-07-04 DIAGNOSIS — N760 Acute vaginitis: Secondary | ICD-10-CM | POA: Diagnosis not present

## 2022-07-04 DIAGNOSIS — N939 Abnormal uterine and vaginal bleeding, unspecified: Secondary | ICD-10-CM | POA: Diagnosis not present

## 2022-07-04 MED ORDER — METRONIDAZOLE 0.75 % VA GEL
1.0000 | Freq: Every evening | VAGINAL | 0 refills | Status: DC
  Filled 2022-07-04: qty 70, 7d supply, fill #0

## 2022-07-25 ENCOUNTER — Other Ambulatory Visit (HOSPITAL_BASED_OUTPATIENT_CLINIC_OR_DEPARTMENT_OTHER): Payer: Self-pay | Admitting: Internal Medicine

## 2022-07-25 DIAGNOSIS — Z1231 Encounter for screening mammogram for malignant neoplasm of breast: Secondary | ICD-10-CM

## 2022-08-01 DIAGNOSIS — Z0000 Encounter for general adult medical examination without abnormal findings: Secondary | ICD-10-CM | POA: Diagnosis not present

## 2022-08-01 DIAGNOSIS — Z13220 Encounter for screening for lipoid disorders: Secondary | ICD-10-CM | POA: Diagnosis not present

## 2022-08-01 DIAGNOSIS — Z131 Encounter for screening for diabetes mellitus: Secondary | ICD-10-CM | POA: Diagnosis not present

## 2022-08-13 ENCOUNTER — Other Ambulatory Visit (HOSPITAL_BASED_OUTPATIENT_CLINIC_OR_DEPARTMENT_OTHER): Payer: Self-pay

## 2022-08-14 ENCOUNTER — Ambulatory Visit (HOSPITAL_BASED_OUTPATIENT_CLINIC_OR_DEPARTMENT_OTHER)
Admission: RE | Admit: 2022-08-14 | Discharge: 2022-08-14 | Disposition: A | Discharge status: Home / Self Care | Payer: 59 | Source: Ambulatory Visit | Attending: Internal Medicine | Admitting: Internal Medicine

## 2022-08-14 ENCOUNTER — Encounter (HOSPITAL_BASED_OUTPATIENT_CLINIC_OR_DEPARTMENT_OTHER): Payer: Self-pay

## 2022-08-14 DIAGNOSIS — Z1231 Encounter for screening mammogram for malignant neoplasm of breast: Secondary | ICD-10-CM | POA: Insufficient documentation

## 2022-08-17 ENCOUNTER — Other Ambulatory Visit (HOSPITAL_BASED_OUTPATIENT_CLINIC_OR_DEPARTMENT_OTHER): Payer: Self-pay

## 2022-08-28 ENCOUNTER — Other Ambulatory Visit (HOSPITAL_BASED_OUTPATIENT_CLINIC_OR_DEPARTMENT_OTHER): Payer: Self-pay

## 2022-10-03 ENCOUNTER — Other Ambulatory Visit (HOSPITAL_BASED_OUTPATIENT_CLINIC_OR_DEPARTMENT_OTHER): Payer: Self-pay

## 2022-10-03 MED ORDER — ROSUVASTATIN CALCIUM 5 MG PO TABS
5.0000 mg | ORAL_TABLET | Freq: Every day | ORAL | 3 refills | Status: DC
  Filled 2022-10-03: qty 90, 90d supply, fill #0
  Filled 2023-01-01: qty 30, 30d supply, fill #1

## 2022-10-17 ENCOUNTER — Telehealth: Payer: 59 | Admitting: Physician Assistant

## 2022-10-17 ENCOUNTER — Other Ambulatory Visit (HOSPITAL_BASED_OUTPATIENT_CLINIC_OR_DEPARTMENT_OTHER): Payer: Self-pay

## 2022-10-17 DIAGNOSIS — R3989 Other symptoms and signs involving the genitourinary system: Secondary | ICD-10-CM | POA: Diagnosis not present

## 2022-10-17 MED ORDER — SULFAMETHOXAZOLE-TRIMETHOPRIM 800-160 MG PO TABS
1.0000 | ORAL_TABLET | Freq: Two times a day (BID) | ORAL | 0 refills | Status: DC
Start: 2022-10-17 — End: 2022-12-05
  Filled 2022-10-17: qty 10, 5d supply, fill #0

## 2022-10-17 NOTE — Progress Notes (Signed)
E-Visit for Urinary Problems  We are sorry that you are not feeling well.  Here is how we plan to help!  Based on what you shared with me it looks like you most likely have a simple urinary tract infection.  A UTI (Urinary Tract Infection) is a bacterial infection of the bladder.  Most cases of urinary tract infections are simple to treat but a key part of your care is to encourage you to drink plenty of fluids and watch your symptoms carefully.  I have prescribed Bactrim DS One tablet twice a day for 5 days.  Your symptoms should gradually improve. Call us if the burning in your urine worsens, you develop worsening fever, back pain or pelvic pain or if your symptoms do not resolve after completing the antibiotic.  Urinary tract infections can be prevented by drinking plenty of water to keep your body hydrated.  Also be sure when you wipe, wipe from front to back and don't hold it in!  If possible, empty your bladder every 4 hours.  HOME CARE Drink plenty of fluids Compete the full course of the antibiotics even if the symptoms resolve Remember, when you need to go.go. Holding in your urine can increase the likelihood of getting a UTI! GET HELP RIGHT AWAY IF: You cannot urinate You get a high fever Worsening back pain occurs You see blood in your urine You feel sick to your stomach or throw up You feel like you are going to pass out  MAKE SURE YOU  Understand these instructions. Will watch your condition. Will get help right away if you are not doing well or get worse.   Thank you for choosing an e-visit.  Your e-visit answers were reviewed by a board certified advanced clinical practitioner to complete your personal care plan. Depending upon the condition, your plan could have included both over the counter or prescription medications.  Please review your pharmacy choice. Make sure the pharmacy is open so you can pick up prescription now. If there is a problem, you may contact  your provider through MyChart messaging and have the prescription routed to another pharmacy.  Your safety is important to us. If you have drug allergies check your prescription carefully.   For the next 24 hours you can use MyChart to ask questions about today's visit, request a non-urgent call back, or ask for a work or school excuse. You will get an email in the next two days asking about your experience. I hope that your e-visit has been valuable and will speed your recovery.  I have spent 5 minutes in review of e-visit questionnaire, review and updating patient chart, medical decision making and response to patient.   Jennifer M Burnette, PA-C  

## 2022-11-16 ENCOUNTER — Other Ambulatory Visit (HOSPITAL_BASED_OUTPATIENT_CLINIC_OR_DEPARTMENT_OTHER): Payer: Self-pay

## 2022-11-16 MED ORDER — COVID-19 MRNA VAC-TRIS(PFIZER) 30 MCG/0.3ML IM SUSY
0.3000 mL | PREFILLED_SYRINGE | Freq: Once | INTRAMUSCULAR | 0 refills | Status: AC
  Filled 2022-11-16: qty 0.3, 1d supply, fill #0

## 2022-12-04 ENCOUNTER — Telehealth: Payer: 59 | Admitting: Physician Assistant

## 2022-12-04 DIAGNOSIS — R3989 Other symptoms and signs involving the genitourinary system: Secondary | ICD-10-CM

## 2022-12-05 ENCOUNTER — Other Ambulatory Visit (HOSPITAL_BASED_OUTPATIENT_CLINIC_OR_DEPARTMENT_OTHER): Payer: Self-pay

## 2022-12-05 MED ORDER — CEPHALEXIN 500 MG PO CAPS
500.0000 mg | ORAL_CAPSULE | Freq: Two times a day (BID) | ORAL | 0 refills | Status: AC
  Filled 2022-12-05: qty 14, 7d supply, fill #0

## 2022-12-05 NOTE — Progress Notes (Signed)
I have spent 5 minutes in review of e-visit questionnaire, review and updating patient chart, medical decision making and response to patient.   Mia Milan Cody Jacklynn Dehaas, PA-C    

## 2022-12-05 NOTE — Progress Notes (Signed)

## 2022-12-20 ENCOUNTER — Other Ambulatory Visit (HOSPITAL_BASED_OUTPATIENT_CLINIC_OR_DEPARTMENT_OTHER): Payer: Self-pay

## 2022-12-20 DIAGNOSIS — Z129 Encounter for screening for malignant neoplasm, site unspecified: Secondary | ICD-10-CM | POA: Diagnosis not present

## 2022-12-20 DIAGNOSIS — D2272 Melanocytic nevi of left lower limb, including hip: Secondary | ICD-10-CM | POA: Diagnosis not present

## 2022-12-20 DIAGNOSIS — D2371 Other benign neoplasm of skin of right lower limb, including hip: Secondary | ICD-10-CM | POA: Diagnosis not present

## 2022-12-20 DIAGNOSIS — D2261 Melanocytic nevi of right upper limb, including shoulder: Secondary | ICD-10-CM | POA: Diagnosis not present

## 2022-12-20 DIAGNOSIS — L700 Acne vulgaris: Secondary | ICD-10-CM | POA: Diagnosis not present

## 2022-12-20 DIAGNOSIS — D2262 Melanocytic nevi of left upper limb, including shoulder: Secondary | ICD-10-CM | POA: Diagnosis not present

## 2022-12-20 DIAGNOSIS — D225 Melanocytic nevi of trunk: Secondary | ICD-10-CM | POA: Diagnosis not present

## 2022-12-20 MED ORDER — CLINDAMYCIN PHOSPHATE 1 % EX SOLN
1.0000 | Freq: Two times a day (BID) | CUTANEOUS | 11 refills | Status: AC
  Filled 2022-12-20: qty 60, 30d supply, fill #0
  Filled 2023-04-04: qty 60, 30d supply, fill #1
  Filled 2023-08-09: qty 60, 30d supply, fill #2
  Filled 2023-10-29: qty 60, 30d supply, fill #3

## 2022-12-26 DIAGNOSIS — D251 Intramural leiomyoma of uterus: Secondary | ICD-10-CM | POA: Diagnosis not present

## 2022-12-26 DIAGNOSIS — Z01419 Encounter for gynecological examination (general) (routine) without abnormal findings: Secondary | ICD-10-CM | POA: Diagnosis not present

## 2022-12-26 DIAGNOSIS — N952 Postmenopausal atrophic vaginitis: Secondary | ICD-10-CM | POA: Diagnosis not present

## 2022-12-28 ENCOUNTER — Other Ambulatory Visit (HOSPITAL_BASED_OUTPATIENT_CLINIC_OR_DEPARTMENT_OTHER): Payer: Self-pay

## 2022-12-28 ENCOUNTER — Ambulatory Visit
Admission: EM | Admit: 2022-12-28 | Discharge: 2022-12-28 | Disposition: A | Discharge status: Home / Self Care | Payer: 59 | Attending: Family Medicine | Admitting: Family Medicine

## 2022-12-28 DIAGNOSIS — J019 Acute sinusitis, unspecified: Secondary | ICD-10-CM

## 2022-12-28 DIAGNOSIS — B9789 Other viral agents as the cause of diseases classified elsewhere: Secondary | ICD-10-CM | POA: Diagnosis not present

## 2022-12-28 DIAGNOSIS — J208 Acute bronchitis due to other specified organisms: Secondary | ICD-10-CM | POA: Diagnosis not present

## 2022-12-28 MED ORDER — HYDROCODONE BIT-HOMATROP MBR 5-1.5 MG/5ML PO SOLN
5.0000 mL | Freq: Two times a day (BID) | ORAL | 0 refills | Status: DC | PRN
  Filled 2022-12-28: qty 100, 10d supply, fill #0

## 2022-12-28 MED ORDER — BENZONATATE 100 MG PO CAPS
100.0000 mg | ORAL_CAPSULE | Freq: Three times a day (TID) | ORAL | 0 refills | Status: DC | PRN
  Filled 2022-12-28: qty 40, 7d supply, fill #0

## 2022-12-28 MED ORDER — PREDNISONE 20 MG PO TABS
20.0000 mg | ORAL_TABLET | Freq: Every day | ORAL | 0 refills | Status: AC
  Filled 2022-12-28: qty 5, 5d supply, fill #0

## 2022-12-28 NOTE — Discharge Instructions (Signed)
If worsen or do not improve return for evaluation.  Discussed I prescribed her a short course of Hycodan cough syrup no refills on controlled cough medications therefore take it only when symptoms are at their worst.  Recommend continuing the Robitussin for symptoms of mild cough.  The prednisone should help treat the inflammation in her lungs which is causing him to cough.

## 2022-12-28 NOTE — ED Triage Notes (Signed)
Pt presents to UC w/ c/o cough and sinus congestion x5 days. Pt has taken Robitussim dm for cough. Home covid negative

## 2022-12-28 NOTE — ED Provider Notes (Signed)
[facility]    CSN: [id number] Arrival date & time: 12/28/22  1334      History   Chief Complaint Chief Complaint  Patient presents with   Cough    HPI Elaine Wyatt is a 52 y.o. female.   HPI Elaine Wyatt is a 52 y.o. female who complains of congestion, sore throat, and dry cough for 5 days. She denies a history of shortness of breath, fatigue, fevers, and wheezing and denies a history of asthma. Patient denies smoke cigarettes. Patient has trialed otc medication without any improvement of symptoms. Past Medical History:  Diagnosis Date   Seasonal allergies     There are no problems to display for this patient.   Past Surgical History:  Procedure Laterality Date   uterine fibroidectomy      OB History   No obstetric history on file.      Home Medications    Prior to Admission medications   Medication Sig Start Date End Date Taking? Authorizing Provider  benzonatate (TESSALON) 100 MG capsule Take 1-2 capsules (100-200 mg total) by mouth 3 (three) times daily as needed for cough. 12/28/22  Yes Bing Neighbors, NP  HYDROcodone bit-homatropine (HYCODAN) 5-1.5 MG/5ML syrup Take 5 mLs by mouth every 12 (twelve) hours as needed for cough. 12/28/22  Yes Bing Neighbors, NP  predniSONE (DELTASONE) 20 MG tablet Take 1 tablet (20 mg total) by mouth daily with breakfast for 5 days. 12/28/22 01/02/23 Yes Bing Neighbors, NP  cetirizine (ZYRTEC) 10 MG tablet Take 10 mg by mouth daily.    [provider]  clindamycin (CLEOCIN T) 1 % external solution Apply topically twice a day 04/10/21     clindamycin (CLEOCIN T) 1 % external solution Apply to affected area(s) twice daily 11/28/21     clindamycin (CLEOCIN T) 1 % external solution Apply 1 Application topically 2 (two) times daily. 12/20/22     rosuvastatin (CRESTOR) 5 MG tablet Take 1 tablet (5 mg total) by mouth at bedtime. 10/03/22       Family History History reviewed. No pertinent family  history.  Social History Social History   Tobacco Use   Smoking status: Never   Smokeless tobacco: Never  Substance Use Topics   Alcohol use: Yes     Allergies   Codeine   Review of Systems Review of Systems Pertinent negatives listed in HPI   Physical Exam Triage Vital Signs ED Triage Vitals  Encounter Vitals Group     BP 12/28/22 1344 124/82     Systolic BP Percentile --      Diastolic BP Percentile --      Pulse Rate 12/28/22 1344 75     Resp 12/28/22 1344 16     Temp 12/28/22 1344 98.3 F (36.8 C)     Temp Source 12/28/22 1344 Oral     SpO2 12/28/22 1344 100 %     Weight --      Height --      Head Circumference --      Peak Flow --      Pain Score 12/28/22 1343 0     Pain Loc --      Pain Education --      Exclude from Growth Chart --    No data found.  Updated Vital Signs BP 124/82 (BP Location: Right Arm)   Pulse 75   Temp 98.3 F (36.8 C) (Oral)   Resp 16   SpO2 100%  Visual Acuity Right Eye Distance:   Left Eye Distance:   Bilateral Distance:    Right Eye Near:   Left Eye Near:    Bilateral Near:     Physical Exam Vitals reviewed.  Constitutional:      Appearance: Normal appearance.  HENT:     Head: Normocephalic and atraumatic.     Nose: Congestion and rhinorrhea present.  Eyes:     Extraocular Movements: Extraocular movements intact.     Pupils: Pupils are equal, round, and reactive to light.  Cardiovascular:     Rate and Rhythm: Normal rate and regular rhythm.  Pulmonary:     Effort: Pulmonary effort is normal.     Breath sounds: Rhonchi present.  Skin:    General: Skin is warm and dry.  Neurological:     General: No focal deficit present.     Mental Status: She is alert.      UC Treatments / Results  Labs (all labs ordered are listed, but only abnormal results are displayed) Labs Reviewed - No data to display  EKG   Radiology No results found.  Procedures Procedures (including critical care  time)  Medications Ordered in UC Medications - No data to display  Initial Impression / Assessment and Plan / UC Course  I have reviewed the triage vital signs and the nursing notes.  Pertinent labs & imaging results that were available during my care of the patient were reviewed by me and considered in my medical decision making (see chart for details).    Viral colitis with acute viral sinusitis, uncomplicated treatment with prednisone 20 mg daily for 5 days.  Patient requested specifically Hycodan cough syrup agreed to prescribe a short quantity to help improve with cough and Tessalon Perles to use during the day for cough relief.  [place] Substance Registry reviewed.,  No contraindication.  Patient advised to return if symptoms worsen or do not improve. Final diagnoses:  Viral bronchitis  Acute viral sinusitis     Discharge Instructions      If worsen or do not improve return for evaluation.  Discussed I prescribed her a short course of Hycodan cough syrup no refills on controlled cough medications therefore take it only when symptoms are at their worst.  Recommend continuing the Robitussin for symptoms of mild cough.  The prednisone should help treat the inflammation in her lungs which is causing him to cough.     ED Prescriptions     Medication Sig Dispense Auth. Provider   HYDROcodone bit-homatropine (HYCODAN) 5-1.5 MG/5ML syrup Take 5 mLs by mouth every 12 (twelve) hours as needed for cough. 100 mL Bing Neighbors, NP   predniSONE (DELTASONE) 20 MG tablet Take 1 tablet (20 mg total) by mouth daily with breakfast for 5 days. 5 tablet Bing Neighbors, NP   benzonatate (TESSALON) 100 MG capsule Take 1-2 capsules (100-200 mg total) by mouth 3 (three) times daily as needed for cough. 40 capsule Bing Neighbors, NP      PDMP not reviewed this encounter.   Bing Neighbors, NP 12/28/22 1550

## 2022-12-31 ENCOUNTER — Telehealth: Payer: 59

## 2022-12-31 DIAGNOSIS — J019 Acute sinusitis, unspecified: Secondary | ICD-10-CM

## 2022-12-31 DIAGNOSIS — B9689 Other specified bacterial agents as the cause of diseases classified elsewhere: Secondary | ICD-10-CM | POA: Diagnosis not present

## 2023-01-01 ENCOUNTER — Other Ambulatory Visit: Payer: Self-pay

## 2023-01-01 ENCOUNTER — Other Ambulatory Visit (HOSPITAL_BASED_OUTPATIENT_CLINIC_OR_DEPARTMENT_OTHER): Payer: Self-pay

## 2023-01-01 MED ORDER — AMOXICILLIN-POT CLAVULANATE 875-125 MG PO TABS
1.0000 | ORAL_TABLET | Freq: Two times a day (BID) | ORAL | 0 refills | Status: DC
  Filled 2023-01-01: qty 20, 10d supply, fill #0

## 2023-01-01 NOTE — Progress Notes (Signed)
E-Visit for Sinus Problems  We are sorry that you are not feeling well.  Here is how we plan to help!  Based on what you have shared with me it looks like you have sinusitis.  Sinusitis is inflammation and infection in the sinus cavities of the head.  Based on your presentation I believe you most likely have Acute Bacterial Sinusitis.  This is an infection caused by bacteria and is treated with antibiotics. I have prescribed  Augmentin 875mg/125mg one tablet twice daily with food, for 10 days.You may use an oral decongestant such as Mucinex D or if you have glaucoma or high blood pressure use plain Mucinex. Saline nasal spray help and can safely be used as often as needed for congestion.  If you develop worsening sinus pain, fever or notice severe headache and vision changes, or if symptoms are not better after completion of antibiotic, please schedule an appointment with a health care provider.    Sinus infections are not as easily transmitted as other respiratory infection, however we still recommend that you avoid close contact with loved ones, especially the very young and elderly.  Remember to wash your hands thoroughly throughout the day as this is the number one way to prevent the spread of infection!  Home Care: Only take medications as instructed by your medical team. Complete the entire course of an antibiotic. Do not take these medications with alcohol. A steam or ultrasonic humidifier can help congestion.  You can place a towel over your head and breathe in the steam from hot water coming from a faucet. Avoid close contacts especially the very young and the elderly. Cover your mouth when you cough or sneeze. Always remember to wash your hands.  Get Help Right Away If: You develop worsening fever or sinus pain. You develop a severe head ache or visual changes. Your symptoms persist after you have completed your treatment plan.  Make sure you Understand these instructions. Will  watch your condition. Will get help right away if you are not doing well or get worse.  Thank you for choosing an e-visit.  Your e-visit answers were reviewed by a board certified advanced clinical practitioner to complete your personal care plan. Depending upon the condition, your plan could have included both over the counter or prescription medications.  Please review your pharmacy choice. Make sure the pharmacy is open so you can pick up prescription now. If there is a problem, you may contact your provider through MyChart messaging and have the prescription routed to another pharmacy.  Your safety is important to us. If you have drug allergies check your prescription carefully.   For the next 24 hours you can use MyChart to ask questions about today's visit, request a non-urgent call back, or ask for a work or school excuse. You will get an email in the next two days asking about your experience. I hope that your e-visit has been valuable and will speed your recovery.  I have spent 5 minutes in review of e-visit questionnaire, review and updating patient chart, medical decision making and response to patient.   Kaelee Pfeffer M Teola Felipe, PA-C  

## 2023-02-08 ENCOUNTER — Other Ambulatory Visit (HOSPITAL_COMMUNITY): Payer: Self-pay

## 2023-02-08 ENCOUNTER — Other Ambulatory Visit: Payer: Self-pay

## 2023-02-08 MED ORDER — ROSUVASTATIN CALCIUM 5 MG PO TABS
5.0000 mg | ORAL_TABLET | Freq: Every day | ORAL | 5 refills | Status: DC
  Filled 2023-02-08: qty 30, 30d supply, fill #0
  Filled 2023-03-06: qty 30, 30d supply, fill #1
  Filled 2023-04-04: qty 90, 90d supply, fill #2
  Filled 2023-07-01: qty 30, 30d supply, fill #3

## 2023-04-05 ENCOUNTER — Other Ambulatory Visit: Payer: Self-pay

## 2023-07-02 ENCOUNTER — Other Ambulatory Visit (HOSPITAL_BASED_OUTPATIENT_CLINIC_OR_DEPARTMENT_OTHER): Payer: Self-pay

## 2023-07-30 ENCOUNTER — Other Ambulatory Visit (HOSPITAL_BASED_OUTPATIENT_CLINIC_OR_DEPARTMENT_OTHER): Payer: Self-pay

## 2023-07-30 MED ORDER — ROSUVASTATIN CALCIUM 5 MG PO TABS
5.0000 mg | ORAL_TABLET | Freq: Every day | ORAL | 5 refills | Status: DC
  Filled 2023-07-30: qty 90, 90d supply, fill #0
  Filled 2023-10-29: qty 90, 90d supply, fill #1

## 2023-08-02 DIAGNOSIS — Z0001 Encounter for general adult medical examination with abnormal findings: Secondary | ICD-10-CM | POA: Diagnosis not present

## 2023-08-02 DIAGNOSIS — Z0000 Encounter for general adult medical examination without abnormal findings: Secondary | ICD-10-CM | POA: Diagnosis not present

## 2023-08-02 DIAGNOSIS — E785 Hyperlipidemia, unspecified: Secondary | ICD-10-CM | POA: Diagnosis not present

## 2023-08-02 DIAGNOSIS — R7301 Impaired fasting glucose: Secondary | ICD-10-CM | POA: Diagnosis not present

## 2023-08-02 DIAGNOSIS — Z1231 Encounter for screening mammogram for malignant neoplasm of breast: Secondary | ICD-10-CM | POA: Diagnosis not present

## 2023-08-05 ENCOUNTER — Other Ambulatory Visit (HOSPITAL_BASED_OUTPATIENT_CLINIC_OR_DEPARTMENT_OTHER): Payer: Self-pay | Admitting: Internal Medicine

## 2023-08-05 DIAGNOSIS — Z1231 Encounter for screening mammogram for malignant neoplasm of breast: Secondary | ICD-10-CM

## 2023-08-09 ENCOUNTER — Other Ambulatory Visit (HOSPITAL_BASED_OUTPATIENT_CLINIC_OR_DEPARTMENT_OTHER): Payer: Self-pay

## 2023-08-20 ENCOUNTER — Ambulatory Visit (HOSPITAL_BASED_OUTPATIENT_CLINIC_OR_DEPARTMENT_OTHER)
Admission: RE | Admit: 2023-08-20 | Discharge: 2023-08-20 | Disposition: A | Discharge status: Home / Self Care | Source: Ambulatory Visit | Attending: Internal Medicine | Admitting: Internal Medicine

## 2023-08-20 ENCOUNTER — Encounter (HOSPITAL_BASED_OUTPATIENT_CLINIC_OR_DEPARTMENT_OTHER): Payer: Self-pay

## 2023-08-20 DIAGNOSIS — Z1231 Encounter for screening mammogram for malignant neoplasm of breast: Secondary | ICD-10-CM | POA: Diagnosis not present

## 2023-09-27 ENCOUNTER — Other Ambulatory Visit (HOSPITAL_BASED_OUTPATIENT_CLINIC_OR_DEPARTMENT_OTHER): Payer: Self-pay

## 2023-09-27 MED ORDER — PNEUMOCOCCAL 20-VAL CONJ VACC 0.5 ML IM SUSY
0.5000 mL | PREFILLED_SYRINGE | Freq: Once | INTRAMUSCULAR | 0 refills | Status: AC
  Filled 2023-09-27: qty 0.5, 1d supply, fill #0

## 2023-10-11 ENCOUNTER — Other Ambulatory Visit (HOSPITAL_BASED_OUTPATIENT_CLINIC_OR_DEPARTMENT_OTHER): Payer: Self-pay

## 2023-10-21 ENCOUNTER — Other Ambulatory Visit (HOSPITAL_BASED_OUTPATIENT_CLINIC_OR_DEPARTMENT_OTHER): Payer: Self-pay

## 2023-10-23 ENCOUNTER — Telehealth

## 2023-10-23 DIAGNOSIS — R3989 Other symptoms and signs involving the genitourinary system: Secondary | ICD-10-CM

## 2023-10-24 ENCOUNTER — Other Ambulatory Visit (HOSPITAL_BASED_OUTPATIENT_CLINIC_OR_DEPARTMENT_OTHER): Payer: Self-pay

## 2023-10-24 MED ORDER — CEPHALEXIN 500 MG PO CAPS
500.0000 mg | ORAL_CAPSULE | Freq: Two times a day (BID) | ORAL | 0 refills | Status: AC
  Filled 2023-10-24: qty 14, 7d supply, fill #0

## 2023-10-24 NOTE — Progress Notes (Signed)

## 2023-10-24 NOTE — Progress Notes (Signed)
 I have spent 5 minutes in review of e-visit questionnaire, review and updating patient chart, medical decision making and response to patient.   Elsie Velma Lunger, PA-C

## 2023-10-31 ENCOUNTER — Other Ambulatory Visit (HOSPITAL_BASED_OUTPATIENT_CLINIC_OR_DEPARTMENT_OTHER): Payer: Self-pay

## 2023-11-29 ENCOUNTER — Other Ambulatory Visit (HOSPITAL_BASED_OUTPATIENT_CLINIC_OR_DEPARTMENT_OTHER): Payer: Self-pay

## 2023-11-29 MED ORDER — FLUZONE 0.5 ML IM SUSY
0.5000 mL | PREFILLED_SYRINGE | Freq: Once | INTRAMUSCULAR | 0 refills | Status: AC
  Filled 2023-11-29: qty 0.5, 1d supply, fill #0

## 2023-12-16 ENCOUNTER — Other Ambulatory Visit (HOSPITAL_BASED_OUTPATIENT_CLINIC_OR_DEPARTMENT_OTHER): Payer: Self-pay

## 2024-01-10 DIAGNOSIS — Z1151 Encounter for screening for human papillomavirus (HPV): Secondary | ICD-10-CM | POA: Diagnosis not present

## 2024-01-10 DIAGNOSIS — Z124 Encounter for screening for malignant neoplasm of cervix: Secondary | ICD-10-CM | POA: Diagnosis not present

## 2024-01-14 DIAGNOSIS — L821 Other seborrheic keratosis: Secondary | ICD-10-CM | POA: Diagnosis not present

## 2024-01-14 DIAGNOSIS — L700 Acne vulgaris: Secondary | ICD-10-CM | POA: Diagnosis not present

## 2024-01-14 DIAGNOSIS — D2261 Melanocytic nevi of right upper limb, including shoulder: Secondary | ICD-10-CM | POA: Diagnosis not present

## 2024-01-14 DIAGNOSIS — D2272 Melanocytic nevi of left lower limb, including hip: Secondary | ICD-10-CM | POA: Diagnosis not present

## 2024-01-14 DIAGNOSIS — D225 Melanocytic nevi of trunk: Secondary | ICD-10-CM | POA: Diagnosis not present

## 2024-01-14 DIAGNOSIS — D2262 Melanocytic nevi of left upper limb, including shoulder: Secondary | ICD-10-CM | POA: Diagnosis not present

## 2024-01-14 DIAGNOSIS — Z129 Encounter for screening for malignant neoplasm, site unspecified: Secondary | ICD-10-CM | POA: Diagnosis not present

## 2024-01-20 DIAGNOSIS — H5213 Myopia, bilateral: Secondary | ICD-10-CM | POA: Diagnosis not present

## 2024-01-20 DIAGNOSIS — H524 Presbyopia: Secondary | ICD-10-CM | POA: Diagnosis not present

## 2024-01-22 ENCOUNTER — Other Ambulatory Visit: Payer: Self-pay

## 2024-01-22 ENCOUNTER — Other Ambulatory Visit (HOSPITAL_BASED_OUTPATIENT_CLINIC_OR_DEPARTMENT_OTHER): Payer: Self-pay

## 2024-01-22 MED ORDER — ROSUVASTATIN CALCIUM 5 MG PO TABS
5.0000 mg | ORAL_TABLET | Freq: Every day | ORAL | 5 refills | Status: AC
  Filled 2024-01-22: qty 30, 30d supply, fill #0
  Filled 2024-02-24: qty 90, 90d supply, fill #1

## 2024-01-27 ENCOUNTER — Other Ambulatory Visit: Payer: Self-pay

## 2024-01-27 ENCOUNTER — Other Ambulatory Visit (HOSPITAL_BASED_OUTPATIENT_CLINIC_OR_DEPARTMENT_OTHER): Payer: Self-pay

## 2024-01-27 MED ORDER — CLINDAMYCIN PHOSPHATE 1 % EX SOLN
CUTANEOUS | 11 refills | Status: AC
  Filled 2024-01-27: qty 60, 30d supply, fill #0
  Filled 2024-03-26: qty 60, 30d supply, fill #1

## 2024-02-03 ENCOUNTER — Other Ambulatory Visit (HOSPITAL_BASED_OUTPATIENT_CLINIC_OR_DEPARTMENT_OTHER): Payer: Self-pay

## 2024-02-25 ENCOUNTER — Other Ambulatory Visit: Payer: Self-pay
